# Patient Record
Sex: Female | Born: 1955
Health system: Southern US, Community
[De-identification: ages and names within clinical notes are randomized; demographics above are authoritative.]

## PROBLEM LIST (undated history)

## (undated) DIAGNOSIS — R011 Cardiac murmur, unspecified: Secondary | ICD-10-CM

## (undated) DIAGNOSIS — Z923 Personal history of irradiation: Secondary | ICD-10-CM

## (undated) DIAGNOSIS — I10 Essential (primary) hypertension: Secondary | ICD-10-CM

## (undated) DIAGNOSIS — G5603 Carpal tunnel syndrome, bilateral upper limbs: Secondary | ICD-10-CM

## (undated) DIAGNOSIS — G5712 Meralgia paresthetica, left lower limb: Secondary | ICD-10-CM

## (undated) DIAGNOSIS — K5790 Diverticulosis of intestine, part unspecified, without perforation or abscess without bleeding: Secondary | ICD-10-CM

## (undated) DIAGNOSIS — C50919 Malignant neoplasm of unspecified site of unspecified female breast: Secondary | ICD-10-CM

## (undated) HISTORY — DX: Diverticulosis of intestine, part unspecified, without perforation or abscess without bleeding: K57.90

## (undated) HISTORY — DX: Meralgia paresthetica, left lower limb: G57.12

## (undated) HISTORY — PX: CHOLECYSTECTOMY: SHX55

## (undated) HISTORY — DX: Cardiac murmur, unspecified: R01.1

## (undated) HISTORY — DX: Essential (primary) hypertension: I10

## (undated) HISTORY — DX: Carpal tunnel syndrome, bilateral upper limbs: G56.03

---

## 2009-08-22 ENCOUNTER — Emergency Department: Payer: Self-pay | Admitting: Emergency Medicine

## 2012-08-30 DIAGNOSIS — G8929 Other chronic pain: Secondary | ICD-10-CM | POA: Insufficient documentation

## 2013-02-03 LAB — HM PAP SMEAR

## 2014-03-03 ENCOUNTER — Ambulatory Visit: Payer: Self-pay | Admitting: Family Medicine

## 2015-05-04 LAB — HM MAMMOGRAPHY: HM MAMMO: NORMAL (ref 0–4)

## 2016-02-22 ENCOUNTER — Telehealth: Payer: Self-pay | Admitting: Family Medicine

## 2016-02-22 NOTE — Telephone Encounter (Signed)
I returned her call; in regards to a family member; not her personally

## 2016-02-22 NOTE — Telephone Encounter (Signed)
Pt called stated she would like a call back from Dr. Sanda Klein concerning a personal matter.

## 2016-05-24 HISTORY — PX: COLONOSCOPY: SHX174

## 2016-05-24 LAB — HM COLONOSCOPY

## 2016-12-02 DIAGNOSIS — G5712 Meralgia paresthetica, left lower limb: Secondary | ICD-10-CM

## 2016-12-02 DIAGNOSIS — G5603 Carpal tunnel syndrome, bilateral upper limbs: Secondary | ICD-10-CM

## 2016-12-02 DIAGNOSIS — K5790 Diverticulosis of intestine, part unspecified, without perforation or abscess without bleeding: Secondary | ICD-10-CM | POA: Insufficient documentation

## 2016-12-02 DIAGNOSIS — I10 Essential (primary) hypertension: Secondary | ICD-10-CM

## 2017-01-04 ENCOUNTER — Ambulatory Visit: Payer: BLUE CROSS/BLUE SHIELD | Admitting: Unknown Physician Specialty

## 2017-01-10 ENCOUNTER — Encounter: Payer: Self-pay | Admitting: Family Medicine

## 2017-01-10 ENCOUNTER — Encounter (INDEPENDENT_AMBULATORY_CARE_PROVIDER_SITE_OTHER): Payer: Self-pay

## 2017-01-10 ENCOUNTER — Ambulatory Visit (INDEPENDENT_AMBULATORY_CARE_PROVIDER_SITE_OTHER): Payer: BLUE CROSS/BLUE SHIELD | Admitting: Family Medicine

## 2017-01-10 VITALS — BP 127/86 | HR 80 | Temp 98.1°F | Resp 17 | Ht 60.25 in | Wt 187.0 lb

## 2017-01-10 DIAGNOSIS — I10 Essential (primary) hypertension: Secondary | ICD-10-CM

## 2017-01-10 DIAGNOSIS — Z13220 Encounter for screening for lipoid disorders: Secondary | ICD-10-CM

## 2017-01-10 DIAGNOSIS — G5712 Meralgia paresthetica, left lower limb: Secondary | ICD-10-CM

## 2017-01-10 DIAGNOSIS — Z114 Encounter for screening for human immunodeficiency virus [HIV]: Secondary | ICD-10-CM

## 2017-01-10 DIAGNOSIS — R300 Dysuria: Secondary | ICD-10-CM

## 2017-01-10 DIAGNOSIS — H6123 Impacted cerumen, bilateral: Secondary | ICD-10-CM | POA: Diagnosis not present

## 2017-01-10 DIAGNOSIS — Z1239 Encounter for other screening for malignant neoplasm of breast: Secondary | ICD-10-CM

## 2017-01-10 DIAGNOSIS — Z6836 Body mass index (BMI) 36.0-36.9, adult: Secondary | ICD-10-CM

## 2017-01-10 DIAGNOSIS — E6609 Other obesity due to excess calories: Secondary | ICD-10-CM | POA: Diagnosis not present

## 2017-01-10 DIAGNOSIS — Z1159 Encounter for screening for other viral diseases: Secondary | ICD-10-CM

## 2017-01-10 DIAGNOSIS — Z1231 Encounter for screening mammogram for malignant neoplasm of breast: Secondary | ICD-10-CM

## 2017-01-10 DIAGNOSIS — IMO0001 Reserved for inherently not codable concepts without codable children: Secondary | ICD-10-CM

## 2017-01-10 MED ORDER — LOSARTAN POTASSIUM-HCTZ 50-12.5 MG PO TABS: 1.0000 | ORAL_TABLET | Freq: Every day | ORAL | 1 refills | Status: DC

## 2017-01-10 NOTE — Assessment & Plan Note (Signed)
Under good control. Continue current regimen. Continue to monitor. Call with any concerns. 

## 2017-01-10 NOTE — Progress Notes (Signed)
BP 127/86 (BP Location: Left Arm, Patient Position: Sitting, Cuff Size: Large)   Pulse 80   Temp 98.1 F (36.7 C) (Oral)   Resp 17   Ht 5' 0.25" (1.53 m)   Wt 187 lb (84.8 kg)   SpO2 97%   BMI 36.22 kg/m    Subjective:    Patient ID: Jennifer Sanders, female    DOB: October 14, 1956, 61 y.o.   MRN: [id number]  HPI: Jennifer Sanders is a 61 y.o. female  Chief Complaint  Patient presents with  . Establish Care   EAG CLOGGED- usually gets them cleaned out about 2x a year Duration: about a month Involved ear(s):  bilateral Sensation of feeling clogged/plugged: yes- left Decreased/muffled hearing:yes Ear pain: no Fever: no Otorrhea: no Hearing loss: no Upper respiratory infection symptoms: no Using Q-Tips: no Status: stable History of cerumenosis: yes Treatments attempted: debrox about 2x a month  HYPERTENSION Hypertension status: controlled  Satisfied with current treatment? yes Duration of hypertension: chronic BP monitoring frequency:  not checking BP medication side effects:  no Medication compliance: excellent compliance Previous BP meds: losartan-hctz Aspirin: no Recurrent headaches: no Visual changes: no Palpitations: no Dyspnea: no Chest pain: no Lower extremity edema: no Dizzy/lightheaded: no  Active Ambulatory Problems    Diagnosis Date Noted  . Hypertension   . Bilateral carpal tunnel syndrome   . Meralgia paraesthetica, left   . Diverticulosis    Resolved Ambulatory Problems    Diagnosis Date Noted  . No Resolved Ambulatory Problems   Past Medical History:  Diagnosis Date  . Bilateral carpal tunnel syndrome   . Diverticulosis   . Hypertension   . Meralgia paraesthetica, left    Past Surgical History:  Procedure Laterality Date  . CHOLECYSTECTOMY  1990s  . COLONOSCOPY  05/24/2016   internal hemorrhoids, diverticulosis   Outpatient Encounter Prescriptions as of 01/10/2017  Medication Sig  . desonide (DESOWEN) 0.05 % lotion Apply  topically.  Marland Kitchen etodolac (LODINE) 500 MG tablet Take by mouth.  . gabapentin (NEURONTIN) 100 MG capsule One by mouth TID and may increase the night dose to 3 if needed.  Marland Kitchen losartan-hydrochlorothiazide (HYZAAR) 50-12.5 MG tablet Take 1 tablet by mouth daily.  Marland Kitchen pyridoxine (B-6) 100 MG tablet Take by mouth.  . tobramycin-dexamethasone (TOBRADEX) ophthalmic ointment Per eye doctor, use for flares of skin irritation around eyes  . [DISCONTINUED] losartan-hydrochlorothiazide (HYZAAR) 50-12.5 MG tablet Take by mouth.   No facility-administered encounter medications on file as of 01/10/2017.    No Known Allergies  Social History   Social History  . Marital status: Married    Spouse name: N/A  . Number of children: N/A  . Years of education: N/A   Social History Main Topics  . Smoking status: Never Smoker  . Smokeless tobacco: Never Used  . Alcohol use No  . Drug use: No  . Sexual activity: Not Asked   Other Topics Concern  . None   Social History Narrative  . None   Family History  Problem Relation Age of Onset  . Hyperlipidemia Mother   . Cancer Mother     lung  . Cancer Father     esophageal    Review of Systems  Constitutional: Negative.   HENT: Positive for congestion, ear discharge and ear pain. Negative for dental problem, drooling, facial swelling, hearing loss, mouth sores, nosebleeds, postnasal drip, rhinorrhea, sinus pressure, sneezing, sore throat, tinnitus, trouble swallowing and voice change.   Respiratory: Negative.   Cardiovascular:  Negative.   Psychiatric/Behavioral: Negative.     Per HPI unless specifically indicated above     Objective:    BP 127/86 (BP Location: Left Arm, Patient Position: Sitting, Cuff Size: Large)   Pulse 80   Temp 98.1 F (36.7 C) (Oral)   Resp 17   Ht 5' 0.25" (1.53 m)   Wt 187 lb (84.8 kg)   SpO2 97%   BMI 36.22 kg/m   Wt Readings from Last 3 Encounters:  01/10/17 187 lb (84.8 kg)    Physical Exam  Constitutional: She  is oriented to person, place, and time. She appears well-developed and well-nourished. No distress.  HENT:  Head: Normocephalic and atraumatic.  Right Ear: Hearing and external ear normal.  Left Ear: Hearing and external ear normal.  Nose: Nose normal.  Mouth/Throat: Oropharynx is clear and moist. No oropharyngeal exudate.  Cerumen impaction bilaterally  Eyes: Conjunctivae, EOM and lids are normal. Pupils are equal, round, and reactive to light. Right eye exhibits no discharge. Left eye exhibits no discharge. No scleral icterus.  Neck: Normal range of motion. Neck supple. No JVD present. No tracheal deviation present. No thyromegaly present.  Cardiovascular: Normal rate, regular rhythm, normal heart sounds and intact distal pulses.  Exam reveals no gallop and no friction rub.   No murmur heard. Pulmonary/Chest: Effort normal and breath sounds normal. No stridor. No respiratory distress. She has no wheezes. She has no rales. She exhibits no tenderness.  Musculoskeletal: Normal range of motion.  Lymphadenopathy:    She has no cervical adenopathy.  Neurological: She is alert and oriented to person, place, and time.  Skin: Skin is warm, dry and intact. No rash noted. She is not diaphoretic. No erythema. No pallor.  Psychiatric: She has a normal mood and affect. Her speech is normal and behavior is normal. Judgment and thought content normal. Cognition and memory are normal.  Nursing note and vitals reviewed.   Results for orders placed or performed in visit on 12/02/16  HM MAMMOGRAPHY  Result Value Ref Range   HM Mammogram Self Reported Normal 0-4 Bi-Rad, Self Reported Normal  HM PAP SMEAR  Result Value Ref Range   HM Pap smear per care everywhere   HM COLONOSCOPY  Result Value Ref Range   HM Colonoscopy Patient Reported See Report (in chart), Patient Reported      Assessment & Plan:   Problem List Items Addressed This Visit      Cardiovascular and Mediastinum   Hypertension -  Primary    Under good control. Continue current regimen. Continue to monitor. Call with any concerns.       Relevant Medications   losartan-hydrochlorothiazide (HYZAAR) 50-12.5 MG tablet   Other Relevant Orders   CBC with Differential/Platelet   Comprehensive metabolic panel   Microalbumin, Urine Waived     Nervous and Auditory   Meralgia paraesthetica, left    Stretches given today. Call with any concerns.       Relevant Medications   gabapentin (NEURONTIN) 100 MG capsule    Other Visit Diagnoses    Dysuria       UA done today.   Relevant Orders   UA/M w/rflx Culture, Routine   Screening for HIV (human immunodeficiency virus)       Labs drawn today. Await results.    Relevant Orders   HIV antibody   Screening for cholesterol level       Labs drawn today. Await results.    Relevant Orders   Lipid  Panel w/o Chol/HDL Ratio   Need for hepatitis C screening test       Labd drawn today. Await results.    Relevant Orders   Hepatitis C Antibody   Class 2 obesity due to excess calories with serious comorbidity and body mass index (BMI) of 36.0 to 36.9 in adult       Checking labs. Continue diet and exercise. Call with any concerns.    Relevant Orders   CBC with Differential/Platelet   Comprehensive metabolic panel   TSH   Screening for breast cancer       Mammogram ordered today.   Relevant Orders   MM DIGITAL SCREENING BILATERAL   Bilateral impacted cerumen       Ears flushed today.        Follow up plan: Return in about 6 months (around 07/10/2017) for Physical.

## 2017-01-10 NOTE — Assessment & Plan Note (Signed)
Stretches given today. Call with any concerns.

## 2017-01-11 ENCOUNTER — Encounter: Payer: Self-pay | Admitting: Family Medicine

## 2017-01-11 LAB — COMPREHENSIVE METABOLIC PANEL
ALBUMIN: 4.6 g/dL (ref 3.6–4.8)
ALT: 14 IU/L (ref 0–32)
AST: 22 IU/L (ref 0–40)
Albumin/Globulin Ratio: 1.4 (ref 1.2–2.2)
Alkaline Phosphatase: 84 IU/L (ref 39–117)
BUN / CREAT RATIO: 13 (ref 12–28)
BUN: 10 mg/dL (ref 8–27)
Bilirubin Total: 0.3 mg/dL (ref 0.0–1.2)
CO2: 27 mmol/L (ref 18–29)
Calcium: 9.6 mg/dL (ref 8.7–10.3)
Chloride: 100 mmol/L (ref 96–106)
Creatinine, Ser: 0.8 mg/dL (ref 0.57–1.00)
GFR, EST AFRICAN AMERICAN: 93 mL/min/{1.73_m2} (ref >59)
GFR, EST NON AFRICAN AMERICAN: 80 mL/min/{1.73_m2} (ref >59)
GLOBULIN, TOTAL: 3.2 g/dL (ref 1.5–4.5)
Glucose: 73 mg/dL (ref 65–99)
POTASSIUM: 3.8 mmol/L (ref 3.5–5.2)
SODIUM: 143 mmol/L (ref 134–144)
TOTAL PROTEIN: 7.8 g/dL (ref 6.0–8.5)

## 2017-01-11 LAB — CBC WITH DIFFERENTIAL/PLATELET
BASOS: 0 %
Basophils Absolute: 0 10*3/uL (ref 0.0–0.2)
EOS (ABSOLUTE): 0.2 10*3/uL (ref 0.0–0.4)
EOS: 2 %
HEMATOCRIT: 42.2 % (ref 34.0–46.6)
HEMOGLOBIN: 14.2 g/dL (ref 11.1–15.9)
IMMATURE GRANS (ABS): 0 10*3/uL (ref 0.0–0.1)
IMMATURE GRANULOCYTES: 0 %
Lymphocytes Absolute: 2 10*3/uL (ref 0.7–3.1)
Lymphs: 22 %
MCH: 26.5 pg — ABNORMAL LOW (ref 26.6–33.0)
MCHC: 33.6 g/dL (ref 31.5–35.7)
MCV: 79 fL (ref 79–97)
MONOS ABS: 0.6 10*3/uL (ref 0.1–0.9)
Monocytes: 7 %
NEUTROS PCT: 69 %
Neutrophils Absolute: 6.2 10*3/uL (ref 1.4–7.0)
Platelets: 254 10*3/uL (ref 150–379)
RBC: 5.35 x10E6/uL — ABNORMAL HIGH (ref 3.77–5.28)
RDW: 14.1 % (ref 12.3–15.4)
WBC: 9 10*3/uL (ref 3.4–10.8)

## 2017-01-11 LAB — TSH: TSH: 3.31 u[IU]/mL (ref 0.450–4.500)

## 2017-01-11 LAB — UA/M W/RFLX CULTURE, ROUTINE
Bilirubin, UA: NEGATIVE
Glucose, UA: NEGATIVE
Ketones, UA: NEGATIVE
LEUKOCYTES UA: NEGATIVE
Nitrite, UA: NEGATIVE
PROTEIN UA: NEGATIVE
Urobilinogen, Ur: 0.2 mg/dL (ref 0.2–1.0)
pH, UA: 6 (ref 5.0–7.5)

## 2017-01-11 LAB — LIPID PANEL W/O CHOL/HDL RATIO
Cholesterol, Total: 187 mg/dL (ref 100–199)
HDL: 66 mg/dL (ref >39)
LDL CALC: 102 mg/dL — AB (ref 0–99)
Triglycerides: 93 mg/dL (ref 0–149)
VLDL Cholesterol Cal: 19 mg/dL (ref 5–40)

## 2017-01-11 LAB — HEPATITIS C ANTIBODY: Hep C Virus Ab: <0.1 s/co ratio (ref 0.0–0.9)

## 2017-01-11 LAB — MICROALBUMIN, URINE WAIVED
CREATININE, URINE WAIVED: 50 mg/dL (ref 10–300)
MICROALB, UR WAIVED: 10 mg/L (ref 0–19)
Microalb/Creat Ratio: <30 mg/g (ref <30)

## 2017-01-11 LAB — HIV ANTIBODY (ROUTINE TESTING W REFLEX): HIV Screen 4th Generation wRfx: NONREACTIVE

## 2017-04-07 ENCOUNTER — Telehealth: Payer: Self-pay | Admitting: Family Medicine

## 2017-04-07 NOTE — Telephone Encounter (Signed)
Left message letting patient know that enough was sent to pharmacy to get her until her August appointment.

## 2017-04-07 NOTE — Telephone Encounter (Signed)
Patient needs a script sent to [facility] for Losartan-hydrochlorothiazide 50-12.5mg  tab taken as 1 daily if possible a 90 day supply  Thanks  [phone]  Raena if you need to talk with her.

## 2017-04-07 NOTE — Telephone Encounter (Signed)
Spoke with patient and gave her the information regarding the script.

## 2017-07-17 ENCOUNTER — Ambulatory Visit (INDEPENDENT_AMBULATORY_CARE_PROVIDER_SITE_OTHER): Payer: BLUE CROSS/BLUE SHIELD | Admitting: Family Medicine

## 2017-07-17 ENCOUNTER — Encounter: Payer: Self-pay | Admitting: Family Medicine

## 2017-07-17 VITALS — BP 119/85 | HR 72 | Temp 98.8°F | Ht 60.5 in | Wt 190.3 lb

## 2017-07-17 DIAGNOSIS — Z0000 Encounter for general adult medical examination without abnormal findings: Secondary | ICD-10-CM

## 2017-07-17 DIAGNOSIS — I10 Essential (primary) hypertension: Secondary | ICD-10-CM

## 2017-07-17 DIAGNOSIS — Z1239 Encounter for other screening for malignant neoplasm of breast: Secondary | ICD-10-CM

## 2017-07-17 LAB — UA/M W/RFLX CULTURE, ROUTINE
Bilirubin, UA: NEGATIVE
GLUCOSE, UA: NEGATIVE
KETONES UA: NEGATIVE
LEUKOCYTES UA: NEGATIVE
Nitrite, UA: NEGATIVE
PROTEIN UA: NEGATIVE
RBC, UA: NEGATIVE
SPEC GRAV UA: 1.015 (ref 1.005–1.030)
Urobilinogen, Ur: 0.2 mg/dL (ref 0.2–1.0)
pH, UA: 7 (ref 5.0–7.5)

## 2017-07-17 LAB — MICROALBUMIN, URINE WAIVED
CREATININE, URINE WAIVED: 50 mg/dL (ref 10–300)
MICROALB, UR WAIVED: 10 mg/L (ref 0–19)

## 2017-07-17 LAB — MICROSCOPIC EXAMINATION
Bacteria, UA: NONE SEEN
RBC, UA: NONE SEEN /hpf (ref 0–?)

## 2017-07-17 MED ORDER — LOSARTAN POTASSIUM-HCTZ 50-12.5 MG PO TABS: 1.0000 | ORAL_TABLET | Freq: Every day | ORAL | 1 refills | Status: DC

## 2017-07-17 NOTE — Progress Notes (Signed)
BP 119/85 (BP Location: Left Arm, Patient Position: Sitting, Cuff Size: Large)   Pulse 72   Temp 98.8 F (37.1 C)   Ht 5' 0.5" (1.537 m)   Wt 190 lb 5 oz (86.3 kg)   SpO2 96%   BMI 36.56 kg/m    Subjective:    Patient ID: Jennifer Sanders, female    DOB: December 15, 1955, 61 y.o.   MRN: [id number]  HPI: Jennifer Sanders is a 61 y.o. female presenting on 07/17/2017 for comprehensive medical examination. Current medical complaints include:  Husband was diagnosed with stage 4 cancer and she is his care giver. Has been under a lot of stress. Feeling OK, but not quite like herself. She notes that she is sleeping OK and trying to stay positive. Is not worried about her mood right now. She's hanging in there.   HYPERTENSION Hypertension status: controlled  Satisfied with current treatment? yes Duration of hypertension: chronic BP monitoring frequency:  not checking BP medication side effects:  no Medication compliance: excellent compliance Previous BP meds: losartan-hctz Aspirin: no Recurrent headaches: no Visual changes: no Palpitations: no Dyspnea: no Chest pain: no Lower extremity edema: no Dizzy/lightheaded: no   She currently lives with: husband Menopausal Symptoms: no  Depression Screen done today and results listed below:  Depression screen [facility] 2/9 07/17/2017 01/10/2017  Decreased Interest 0 0  Down, Depressed, Hopeless 1 0  PHQ - 2 Score 1 0  Altered sleeping 0 -  Tired, decreased energy 1 -  Change in appetite 0 -  Feeling bad or failure about yourself  0 -  Trouble concentrating 0 -  Moving slowly or fidgety/restless 0 -  Suicidal thoughts 0 -  PHQ-9 Score 2 -  Difficult doing work/chores Somewhat difficult -    Past Medical History:  Past Medical History:  Diagnosis Date  . Bilateral carpal tunnel syndrome   . Diverticulosis    found on colonoscopy on 05/24/16  . Hypertension   . Meralgia paraesthetica, left     Surgical History:  Past Surgical History:    Procedure Laterality Date  . CHOLECYSTECTOMY  1990s  . COLONOSCOPY  05/24/2016   internal hemorrhoids, diverticulosis    Medications:  Current Outpatient Prescriptions on File Prior to Visit  Medication Sig  . desonide (DESOWEN) 0.05 % lotion Apply topically.  Marland Kitchen etodolac (LODINE) 500 MG tablet Take by mouth.  . gabapentin (NEURONTIN) 100 MG capsule One by mouth TID and may increase the night dose to 3 if needed.  . pyridoxine (B-6) 100 MG tablet Take by mouth.  . tobramycin-dexamethasone (TOBRADEX) ophthalmic ointment Per eye doctor, use for flares of skin irritation around eyes   No current facility-administered medications on file prior to visit.     Allergies:  No Known Allergies  Social History:  Social History   Social History  . Marital status: Married    Spouse name: N/A  . Number of children: N/A  . Years of education: N/A   Occupational History  . Not on file.   Social History Main Topics  . Smoking status: Never Smoker  . Smokeless tobacco: Never Used  . Alcohol use No  . Drug use: No  . Sexual activity: Yes   Other Topics Concern  . Not on file   Social History Narrative  . No narrative on file   History  Smoking Status  . Never Smoker  Smokeless Tobacco  . Never Used   History  Alcohol Use No  Family History:  Family History  Problem Relation Age of Onset  . Hyperlipidemia Mother   . Cancer Mother        lung  . Cancer Father        esophageal    Past medical history, surgical history, medications, allergies, family history and social history reviewed with patient today and changes made to appropriate areas of the chart.   Review of Systems  Constitutional: Negative.   HENT: Negative.   Eyes: Negative.   Respiratory: Negative.   Cardiovascular: Negative.   Gastrointestinal: Positive for blood in stool (due to hemorrhoids). Negative for abdominal pain, constipation, diarrhea, heartburn, melena, nausea and vomiting.   Genitourinary: Negative.   Musculoskeletal: Negative.   Skin: Negative.   Neurological: Positive for tingling. Negative for dizziness, tremors, sensory change, speech change, focal weakness, seizures, loss of consciousness and headaches.  Endo/Heme/Allergies: Negative.   Psychiatric/Behavioral: Positive for depression. Negative for hallucinations, memory loss, substance abuse and suicidal ideas. The patient is nervous/anxious. The patient does not have insomnia.     All other ROS negative except what is listed above and in the HPI.      Objective:    BP 119/85 (BP Location: Left Arm, Patient Position: Sitting, Cuff Size: Large)   Pulse 72   Temp 98.8 F (37.1 C)   Ht 5' 0.5" (1.537 m)   Wt 190 lb 5 oz (86.3 kg)   SpO2 96%   BMI 36.56 kg/m   Wt Readings from Last 3 Encounters:  07/17/17 190 lb 5 oz (86.3 kg)  01/10/17 187 lb (84.8 kg)    Physical Exam  Constitutional: She is oriented to person, place, and time. She appears well-developed and well-nourished. No distress.  HENT:  Head: Normocephalic and atraumatic.  Right Ear: Hearing, tympanic membrane, external ear and ear canal normal.  Left Ear: Hearing, tympanic membrane, external ear and ear canal normal.  Nose: Nose normal.  Mouth/Throat: Uvula is midline, oropharynx is clear and moist and mucous membranes are normal. No oropharyngeal exudate.  Eyes: Pupils are equal, round, and reactive to light. Conjunctivae, EOM and lids are normal. Right eye exhibits no discharge. Left eye exhibits no discharge. No scleral icterus.  Neck: Normal range of motion. Neck supple. No JVD present. No tracheal deviation present. No thyromegaly present.  Cardiovascular: Normal rate, regular rhythm, normal heart sounds and intact distal pulses.  Exam reveals no gallop and no friction rub.   No murmur heard. Pulmonary/Chest: Effort normal and breath sounds normal. No stridor. No respiratory distress. She has no wheezes. She has no rales. She  exhibits no tenderness. Right breast exhibits no inverted nipple, no mass, no nipple discharge, no skin change and no tenderness. Left breast exhibits no inverted nipple, no mass, no nipple discharge, no skin change and no tenderness. Breasts are symmetrical.  Abdominal: Soft. Bowel sounds are normal. She exhibits no distension and no mass. There is no tenderness. There is no rebound and no guarding.  Genitourinary:  Genitourinary Comments: Pelvic exam deferred with shared decision making  Musculoskeletal: Normal range of motion. She exhibits no edema, tenderness or deformity.  Lymphadenopathy:    She has no cervical adenopathy.  Neurological: She is alert and oriented to person, place, and time. She has normal reflexes. She displays normal reflexes. No cranial nerve deficit. She exhibits normal muscle tone. Coordination normal.  Skin: Skin is warm, dry and intact. No rash noted. She is not diaphoretic. No erythema. No pallor.  Psychiatric: She has a normal  mood and affect. Her speech is normal and behavior is normal. Judgment and thought content normal. Cognition and memory are normal.  Nursing note and vitals reviewed.   Results for orders placed or performed in visit on 01/10/17  CBC with Differential/Platelet  Result Value Ref Range   WBC 9.0 3.4 - 10.8 x10E3/uL   RBC 5.35 (H) 3.77 - 5.28 x10E6/uL   Hemoglobin 14.2 11.1 - 15.9 g/dL   Hematocrit 42.2 34.0 - 46.6 %   MCV 79 79 - 97 fL   MCH 26.5 (L) 26.6 - 33.0 pg   MCHC 33.6 31.5 - 35.7 g/dL   RDW 14.1 12.3 - 15.4 %   Platelets 254 150 - 379 x10E3/uL   Neutrophils 69 Not Estab. %   Lymphs 22 Not Estab. %   Monocytes 7 Not Estab. %   Eos 2 Not Estab. %   Basos 0 Not Estab. %   Neutrophils Absolute 6.2 1.4 - 7.0 x10E3/uL   Lymphocytes Absolute 2.0 0.7 - 3.1 x10E3/uL   Monocytes Absolute 0.6 0.1 - 0.9 x10E3/uL   EOS (ABSOLUTE) 0.2 0.0 - 0.4 x10E3/uL   Basophils Absolute 0.0 0.0 - 0.2 x10E3/uL   Immature Granulocytes 0 Not Estab. %     Immature Grans (Abs) 0.0 0.0 - 0.1 x10E3/uL  Comprehensive metabolic panel  Result Value Ref Range   Glucose 73 65 - 99 mg/dL   BUN 10 8 - 27 mg/dL   Creatinine, Ser 0.80 0.57 - 1.00 mg/dL   GFR calc non Af Amer 80 >59 mL/min/1.73   GFR calc Af Amer 93 >59 mL/min/1.73   BUN/Creatinine Ratio 13 12 - 28   Sodium 143 134 - 144 mmol/L   Potassium 3.8 3.5 - 5.2 mmol/L   Chloride 100 96 - 106 mmol/L   CO2 27 18 - 29 mmol/L   Calcium 9.6 8.7 - 10.3 mg/dL   Total Protein 7.8 6.0 - 8.5 g/dL   Albumin 4.6 3.6 - 4.8 g/dL   Globulin, Total 3.2 1.5 - 4.5 g/dL   Albumin/Globulin Ratio 1.4 1.2 - 2.2   Bilirubin Total 0.3 0.0 - 1.2 mg/dL   Alkaline Phosphatase 84 39 - 117 IU/L   AST 22 0 - 40 IU/L   ALT 14 0 - 32 IU/L  Lipid Panel w/o Chol/HDL Ratio  Result Value Ref Range   Cholesterol, Total 187 100 - 199 mg/dL   Triglycerides 93 0 - 149 mg/dL   HDL 66 >39 mg/dL   VLDL Cholesterol Cal 19 5 - 40 mg/dL   LDL Calculated 102 (H) 0 - 99 mg/dL  Microalbumin, Urine Waived  Result Value Ref Range   Microalb, Ur Waived 10 0 - 19 mg/L   Creatinine, Urine Waived 50 10 - 300 mg/dL   Microalb/Creat Ratio <30 <30 mg/g  TSH  Result Value Ref Range   TSH 3.310 0.450 - 4.500 uIU/mL  UA/M w/rflx Culture, Routine  Result Value Ref Range   Specific Gravity, UA <1.005 (L) 1.005 - 1.030   pH, UA 6.0 5.0 - 7.5   Color, UA Yellow Yellow   Appearance Ur Clear Clear   Leukocytes, UA Negative Negative   Protein, UA Negative Negative/Trace   Glucose, UA Negative Negative   Ketones, UA Negative Negative   RBC, UA Trace (A) Negative   Bilirubin, UA Negative Negative   Urobilinogen, Ur 0.2 0.2 - 1.0 mg/dL   Nitrite, UA Negative Negative  HIV antibody  Result Value Ref Range   HIV Screen  4th Generation wRfx Non Reactive Non Reactive  Hepatitis C Antibody  Result Value Ref Range   Hep C Virus Ab <0.1 0.0 - 0.9 s/co ratio      Assessment & Plan:   Problem List Items Addressed This Visit       Cardiovascular and Mediastinum   Hypertension    Under good control. Call with any concerns. Refill given today.       Relevant Medications   losartan-hydrochlorothiazide (HYZAAR) 50-12.5 MG tablet   Other Relevant Orders   Comprehensive metabolic panel   Microalbumin, Urine Waived    Other Visit Diagnoses    Routine general medical examination at a health care facility    -  Primary   Vaccines up to date. Screening labs checked today. Pap and colonoscopy up to date. Mammogram ordered today. Continue diet and exercise.    Relevant Orders   CBC with Differential/Platelet   Comprehensive metabolic panel   Lipid Panel w/o Chol/HDL Ratio   Microalbumin, Urine Waived   TSH   UA/M w/rflx Culture, Routine       Follow up plan: Return in about 6 months (around 01/17/2018) for BP follow up.   LABORATORY TESTING:  - Pap smear: up to date  IMMUNIZATIONS:   - Tdap: Tetanus vaccination status reviewed: last tetanus booster within 10 years. - Influenza: Postponed to flu season - Pneumovax: Not applicable - Prevnar: Not applicable - Zostavax vaccine: Up to date  SCREENING: -Mammogram: Ordered today  - Colonoscopy: Up to date  - Bone Density: Not applicable   PATIENT COUNSELING:   Advised to take 1 mg of folate supplement per day if capable of pregnancy.   Sexuality: Discussed sexually transmitted diseases, partner selection, use of condoms, avoidance of unintended pregnancy  and contraceptive alternatives.   Advised to avoid cigarette smoking.  I discussed with the patient that most people either abstain from alcohol or drink within safe limits (<=14/week and <=4 drinks/occasion for males, <=7/weeks and <= 3 drinks/occasion for females) and that the risk for alcohol disorders and other health effects rises proportionally with the number of drinks per week and how often a drinker exceeds daily limits.  Discussed cessation/primary prevention of drug use and availability of treatment  for abuse.   Diet: Encouraged to adjust caloric intake to maintain  or achieve ideal body weight, to reduce intake of dietary saturated fat and total fat, to limit sodium intake by avoiding high sodium foods and not adding table salt, and to maintain adequate dietary potassium and calcium preferably from fresh fruits, vegetables, and low-fat dairy products.    stressed the importance of regular exercise  Injury prevention: Discussed safety belts, safety helmets, smoke detector, smoking near bedding or upholstery.   Dental health: Discussed importance of regular tooth brushing, flossing, and dental visits.    NEXT PREVENTATIVE PHYSICAL DUE IN 1 YEAR. Return in about 6 months (around 01/17/2018) for BP follow up.

## 2017-07-17 NOTE — Assessment & Plan Note (Signed)
Under good control. Call with any concerns. Refill given today.

## 2017-07-17 NOTE — Patient Instructions (Addendum)
Health Maintenance, Female Adopting a healthy lifestyle and getting preventive care can go a long way to promote health and wellness. Talk with your health care provider about what schedule of regular examinations is right for you. This is a good chance for you to check in with your provider about disease prevention and staying healthy. In between checkups, there are plenty of things you can do on your own. Experts have done a lot of research about which lifestyle changes and preventive measures are most likely to keep you healthy. Ask your health care provider for more information. Weight and diet Eat a healthy diet  Be sure to include plenty of vegetables, fruits, low-fat dairy products, and lean protein.  Do not eat a lot of foods high in solid fats, added sugars, or salt.  Get regular exercise. This is one of the most important things you can do for your health. ? Most adults should exercise for at least 150 minutes each week. The exercise should increase your heart rate and make you sweat (moderate-intensity exercise). ? Most adults should also do strengthening exercises at least twice a week. This is in addition to the moderate-intensity exercise.  Maintain a healthy weight  Body mass index (BMI) is a measurement that can be used to identify possible weight problems. It estimates body fat based on height and weight. Your health care provider can help determine your BMI and help you achieve or maintain a healthy weight.  For females 20 years of age and older: ? A BMI below 18.5 is considered underweight. ? A BMI of 18.5 to 24.9 is normal. ? A BMI of 25 to 29.9 is considered overweight. ? A BMI of 30 and above is considered obese.  Watch levels of cholesterol and blood lipids  You should start having your blood tested for lipids and cholesterol at 61 years of age, then have this test every 5 years.  You may need to have your cholesterol levels checked more often if: ? Your lipid or  cholesterol levels are high. ? You are older than 61 years of age. ? You are at high risk for heart disease.  Cancer screening Lung Cancer  Lung cancer screening is recommended for adults 55-80 years old who are at high risk for lung cancer because of a history of smoking.  A yearly low-dose CT scan of the lungs is recommended for people who: ? Currently smoke. ? Have quit within the past 15 years. ? Have at least a 30-pack-year history of smoking. A pack year is smoking an average of one pack of cigarettes a day for 1 year.  Yearly screening should continue until it has been 15 years since you quit.  Yearly screening should stop if you develop a health problem that would prevent you from having lung cancer treatment.  Breast Cancer  Practice breast self-awareness. This means understanding how your breasts normally appear and feel.  It also means doing regular breast self-exams. Let your health care provider know about any changes, no matter how small.  If you are in your 20s or 30s, you should have a clinical breast exam (CBE) by a health care provider every 1-3 years as part of a regular health exam.  If you are 40 or older, have a CBE every year. Also consider having a breast X-ray (mammogram) every year.  If you have a family history of breast cancer, talk to your health care provider about genetic screening.  If you are at high risk   for breast cancer, talk to your health care provider about having an MRI and a mammogram every year.  Breast cancer gene (BRCA) assessment is recommended for women who have family members with BRCA-related cancers. BRCA-related cancers include: ? Breast. ? Ovarian. ? Tubal. ? Peritoneal cancers.  Results of the assessment will determine the need for genetic counseling and BRCA1 and BRCA2 testing.  Cervical Cancer Your health care provider may recommend that you be screened regularly for cancer of the pelvic organs (ovaries, uterus, and  vagina). This screening involves a pelvic examination, including checking for microscopic changes to the surface of your cervix (Pap test). You may be encouraged to have this screening done every 3 years, beginning at age 22.  For women ages 56-65, health care providers may recommend pelvic exams and Pap testing every 3 years, or they may recommend the Pap and pelvic exam, combined with testing for human papilloma virus (HPV), every 5 years. Some types of HPV increase your risk of cervical cancer. Testing for HPV may also be done on women of any age with unclear Pap test results.  Other health care providers may not recommend any screening for nonpregnant women who are considered low risk for pelvic cancer and who do not have symptoms. Ask your health care provider if a screening pelvic exam is right for you.  If you have had past treatment for cervical cancer or a condition that could lead to cancer, you need Pap tests and screening for cancer for at least 20 years after your treatment. If Pap tests have been discontinued, your risk factors (such as having a new sexual partner) need to be reassessed to determine if screening should resume. Some women have medical problems that increase the chance of getting cervical cancer. In these cases, your health care provider may recommend more frequent screening and Pap tests.  Colorectal Cancer  This type of cancer can be detected and often prevented.  Routine colorectal cancer screening usually begins at 61 years of age and continues through 61 years of age.  Your health care provider may recommend screening at an earlier age if you have risk factors for colon cancer.  Your health care provider may also recommend using home test kits to check for hidden blood in the stool.  A small camera at the end of a tube can be used to examine your colon directly (sigmoidoscopy or colonoscopy). This is done to check for the earliest forms of colorectal  cancer.  Routine screening usually begins at age 33.  Direct examination of the colon should be repeated every 5-10 years through 61 years of age. However, you may need to be screened more often if early forms of precancerous polyps or small growths are found.  Skin Cancer  Check your skin from head to toe regularly.  Tell your health care provider about any new moles or changes in moles, especially if there is a change in a mole's shape or color.  Also tell your health care provider if you have a mole that is larger than the size of a pencil eraser.  Always use sunscreen. Apply sunscreen liberally and repeatedly throughout the day.  Protect yourself by wearing long sleeves, pants, a wide-brimmed hat, and sunglasses whenever you are outside.  Heart disease, diabetes, and high blood pressure  High blood pressure causes heart disease and increases the risk of stroke. High blood pressure is more likely to develop in: ? People who have blood pressure in the high end of  the normal range (130-139/85-89 mm Hg). ? People who are overweight or obese. ? People who are African American.  If you are 21-29 years of age, have your blood pressure checked every 3-5 years. If you are 3 years of age or older, have your blood pressure checked every year. You should have your blood pressure measured twice-once when you are at a hospital or clinic, and once when you are not at a hospital or clinic. Record the average of the two measurements. To check your blood pressure when you are not at a hospital or clinic, you can use: ? An automated blood pressure machine at a pharmacy. ? A home blood pressure monitor.  If you are between 17 years and 37 years old, ask your health care provider if you should take aspirin to prevent strokes.  Have regular diabetes screenings. This involves taking a blood sample to check your fasting blood sugar level. ? If you are at a normal weight and have a low risk for diabetes,  have this test once every three years after 61 years of age. ? If you are overweight and have a high risk for diabetes, consider being tested at a younger age or more often. Preventing infection Hepatitis B  If you have a higher risk for hepatitis B, you should be screened for this virus. You are considered at high risk for hepatitis B if: ? You were born in a country where hepatitis B is common. Ask your health care provider which countries are considered high risk. ? Your parents were born in a high-risk country, and you have not been immunized against hepatitis B (hepatitis B vaccine). ? You have HIV or AIDS. ? You use needles to inject street drugs. ? You live with someone who has hepatitis B. ? You have had sex with someone who has hepatitis B. ? You get hemodialysis treatment. ? You take certain medicines for conditions, including cancer, organ transplantation, and autoimmune conditions.  Hepatitis C  Blood testing is recommended for: ? Everyone born from [birth year] through 1965. ? Anyone with known risk factors for hepatitis C.  Sexually transmitted infections (STIs)  You should be screened for sexually transmitted infections (STIs) including gonorrhea and chlamydia if: ? You are sexually active and are younger than 61 years of age. ? You are older than 61 years of age and your health care provider tells you that you are at risk for this type of infection. ? Your sexual activity has changed since you were last screened and you are at an increased risk for chlamydia or gonorrhea. Ask your health care provider if you are at risk.  If you do not have HIV, but are at risk, it may be recommended that you take a prescription medicine daily to prevent HIV infection. This is called pre-exposure prophylaxis (PrEP). You are considered at risk if: ? You are sexually active and do not regularly use condoms or know the HIV status of your partner(s). ? You take drugs by injection. ? You are  sexually active with a partner who has HIV.  Talk with your health care provider about whether you are at high risk of being infected with HIV. If you choose to begin PrEP, you should first be tested for HIV. You should then be tested every 3 months for as long as you are taking PrEP. Pregnancy  If you are premenopausal and you may become pregnant, ask your health care provider about preconception counseling.  If you may become  pregnant, take 400 to 800 micrograms (mcg) of folic acid every day.  If you want to prevent pregnancy, talk to your health care provider about birth control (contraception). Osteoporosis and menopause  Osteoporosis is a disease in which the bones lose minerals and strength with aging. This can result in serious bone fractures. Your risk for osteoporosis can be identified using a bone density scan.  If you are 28 years of age or older, or if you are at risk for osteoporosis and fractures, ask your health care provider if you should be screened.  Ask your health care provider whether you should take a calcium or vitamin D supplement to lower your risk for osteoporosis.  Menopause may have certain physical symptoms and risks.  Hormone replacement therapy may reduce some of these symptoms and risks. Talk to your health care provider about whether hormone replacement therapy is right for you. Follow these instructions at home:  Schedule regular health, dental, and eye exams.  Stay current with your immunizations.  Do not use any tobacco products including cigarettes, chewing tobacco, or electronic cigarettes.  If you are pregnant, do not drink alcohol.  If you are breastfeeding, limit how much and how often you drink alcohol.  Limit alcohol intake to no more than 1 drink per day for nonpregnant women. One drink equals 12 ounces of beer, 5 ounces of wine, or 1 ounces of hard liquor.  Do not use street drugs.  Do not share needles.  Ask your health care  provider for help if you need support or information about quitting drugs.  Tell your health care provider if you often feel depressed.  Tell your health care provider if you have ever been abused or do not feel safe at home. This information is not intended to replace advice given to you by your health care provider. Make sure you discuss any questions you have with your health care provider. Document Released: 05/30/2011 Document Revised: 04/21/2016 Document Reviewed: 08/18/2015 Elsevier Interactive Patient Education  2018 [place] Maintenance for Postmenopausal Women Menopause is a normal process in which your reproductive ability comes to an end. This process happens gradually over a span of months to years, usually between the ages of 96 and 42. Menopause is complete when you have missed 12 consecutive menstrual periods. It is important to talk with your health care provider about some of the most common conditions that affect postmenopausal women, such as heart disease, cancer, and bone loss (osteoporosis). Adopting a healthy lifestyle and getting preventive care can help to promote your health and wellness. Those actions can also lower your chances of developing some of these common conditions. What should I know about menopause? During menopause, you may experience a number of symptoms, such as:  Moderate-to-severe hot flashes.  Night sweats.  Decrease in sex drive.  Mood swings.  Headaches.  Tiredness.  Irritability.  Memory problems.  Insomnia.  Choosing to treat or not to treat menopausal changes is an individual decision that you make with your health care provider. What should I know about hormone replacement therapy and supplements? Hormone therapy products are effective for treating symptoms that are associated with menopause, such as hot flashes and night sweats. Hormone replacement carries certain risks, especially as you become older. If you are  thinking about using estrogen or estrogen with progestin treatments, discuss the benefits and risks with your health care provider. What should I know about heart disease and stroke? Heart disease, heart attack, and stroke  become more likely as you age. This may be due, in part, to the hormonal changes that your body experiences during menopause. These can affect how your body processes dietary fats, triglycerides, and cholesterol. Heart attack and stroke are both medical emergencies. There are many things that you can do to help prevent heart disease and stroke:  Have your blood pressure checked at least every 1-2 years. High blood pressure causes heart disease and increases the risk of stroke.  If you are 55-79 years old, ask your health care provider if you should take aspirin to prevent a heart attack or a stroke.  Do not use any tobacco products, including cigarettes, chewing tobacco, or electronic cigarettes. If you need help quitting, ask your health care provider.  It is important to eat a healthy diet and maintain a healthy weight. ? Be sure to include plenty of vegetables, fruits, low-fat dairy products, and lean protein. ? Avoid eating foods that are high in solid fats, added sugars, or salt (sodium).  Get regular exercise. This is one of the most important things that you can do for your health. ? Try to exercise for at least 150 minutes each week. The type of exercise that you do should increase your heart rate and make you sweat. This is known as moderate-intensity exercise. ? Try to do strengthening exercises at least twice each week. Do these in addition to the moderate-intensity exercise.  Know your numbers.Ask your health care provider to check your cholesterol and your blood glucose. Continue to have your blood tested as directed by your health care provider.  What should I know about cancer screening? There are several types of cancer. Take the following steps to reduce  your risk and to catch any cancer development as early as possible. Breast Cancer  Practice breast self-awareness. ? This means understanding how your breasts normally appear and feel. ? It also means doing regular breast self-exams. Let your health care provider know about any changes, no matter how small.  If you are 40 or older, have a clinician do a breast exam (clinical breast exam or CBE) every year. Depending on your age, family history, and medical history, it may be recommended that you also have a yearly breast X-ray (mammogram).  If you have a family history of breast cancer, talk with your health care provider about genetic screening.  If you are at high risk for breast cancer, talk with your health care provider about having an MRI and a mammogram every year.  Breast cancer (BRCA) gene test is recommended for women who have family members with BRCA-related cancers. Results of the assessment will determine the need for genetic counseling and BRCA1 and for BRCA2 testing. BRCA-related cancers include these types: ? Breast. This occurs in males or females. ? Ovarian. ? Tubal. This may also be called fallopian tube cancer. ? Cancer of the abdominal or pelvic lining (peritoneal cancer). ? Prostate. ? Pancreatic.  Cervical, Uterine, and Ovarian Cancer Your health care provider may recommend that you be screened regularly for cancer of the pelvic organs. These include your ovaries, uterus, and vagina. This screening involves a pelvic exam, which includes checking for microscopic changes to the surface of your cervix (Pap test).  For women ages 21-65, health care providers may recommend a pelvic exam and a Pap test every three years. For women ages 30-65, they may recommend the Pap test and pelvic exam, combined with testing for human papilloma virus (HPV), every five years. Some types   of HPV increase your risk of cervical cancer. Testing for HPV may also be done on women of any age who  have unclear Pap test results.  Other health care providers may not recommend any screening for nonpregnant women who are considered low risk for pelvic cancer and have no symptoms. Ask your health care provider if a screening pelvic exam is right for you.  If you have had past treatment for cervical cancer or a condition that could lead to cancer, you need Pap tests and screening for cancer for at least 20 years after your treatment. If Pap tests have been discontinued for you, your risk factors (such as having a new sexual partner) need to be reassessed to determine if you should start having screenings again. Some women have medical problems that increase the chance of getting cervical cancer. In these cases, your health care provider may recommend that you have screening and Pap tests more often.  If you have a family history of uterine cancer or ovarian cancer, talk with your health care provider about genetic screening.  If you have vaginal bleeding after reaching menopause, tell your health care provider.  There are currently no reliable tests available to screen for ovarian cancer.  Lung Cancer Lung cancer screening is recommended for adults 20-34 years old who are at high risk for lung cancer because of a history of smoking. A yearly low-dose CT scan of the lungs is recommended if you:  Currently smoke.  Have a history of at least 30 pack-years of smoking and you currently smoke or have quit within the past 15 years. A pack-year is smoking an average of one pack of cigarettes per day for one year.  Yearly screening should:  Continue until it has been 15 years since you quit.  Stop if you develop a health problem that would prevent you from having lung cancer treatment.  Colorectal Cancer  This type of cancer can be detected and can often be prevented.  Routine colorectal cancer screening usually begins at age 91 and continues through age 40.  If you have risk factors for colon  cancer, your health care provider may recommend that you be screened at an earlier age.  If you have a family history of colorectal cancer, talk with your health care provider about genetic screening.  Your health care provider may also recommend using home test kits to check for hidden blood in your stool.  A small camera at the end of a tube can be used to examine your colon directly (sigmoidoscopy or colonoscopy). This is done to check for the earliest forms of colorectal cancer.  Direct examination of the colon should be repeated every 5-10 years until age 59. However, if early forms of precancerous polyps or small growths are found or if you have a family history or genetic risk for colorectal cancer, you may need to be screened more often.  Skin Cancer  Check your skin from head to toe regularly.  Monitor any moles. Be sure to tell your health care provider: ? About any new moles or changes in moles, especially if there is a change in a mole's shape or color. ? If you have a mole that is larger than the size of a pencil eraser.  If any of your family members has a history of skin cancer, especially at a young age, talk with your health care provider about genetic screening.  Always use sunscreen. Apply sunscreen liberally and repeatedly throughout the day.  Whenever you are outside, protect yourself by wearing long sleeves, pants, a wide-brimmed hat, and sunglasses.  What should I know about osteoporosis? Osteoporosis is a condition in which bone destruction happens more quickly than new bone creation. After menopause, you may be at an increased risk for osteoporosis. To help prevent osteoporosis or the bone fractures that can happen because of osteoporosis, the following is recommended:  If you are 13-49 years old, get at least 1,000 mg of calcium and at least 600 mg of vitamin D per day.  If you are older than age 83 but younger than age 38, get at least 1,200 mg of calcium and  at least 600 mg of vitamin D per day.  If you are older than age 77, get at least 1,200 mg of calcium and at least 800 mg of vitamin D per day.  Smoking and excessive alcohol intake increase the risk of osteoporosis. Eat foods that are rich in calcium and vitamin D, and do weight-bearing exercises several times each week as directed by your health care provider. What should I know about how menopause affects my mental health? Depression may occur at any age, but it is more common as you become older. Common symptoms of depression include:  Low or sad mood.  Changes in sleep patterns.  Changes in appetite or eating patterns.  Feeling an overall lack of motivation or enjoyment of activities that you previously enjoyed.  Frequent crying spells.  Talk with your health care provider if you think that you are experiencing depression. What should I know about immunizations? It is important that you get and maintain your immunizations. These include:  Tetanus, diphtheria, and pertussis (Tdap) booster vaccine.  Influenza every year before the flu season begins.  Pneumonia vaccine.  Shingles vaccine.  Your health care provider may also recommend other immunizations. This information is not intended to replace advice given to you by your health care provider. Make sure you discuss any questions you have with your health care provider. Document Released: 01/06/2006 Document Revised: 06/03/2016 Document Reviewed: 08/18/2015 Elsevier Interactive Patient Education  2018 [employer].

## 2017-07-18 ENCOUNTER — Encounter: Payer: Self-pay | Admitting: Family Medicine

## 2017-07-18 LAB — CBC WITH DIFFERENTIAL/PLATELET
BASOS ABS: 0 10*3/uL (ref 0.0–0.2)
Basos: 0 %
EOS (ABSOLUTE): 0.2 10*3/uL (ref 0.0–0.4)
Eos: 2 %
HEMOGLOBIN: 14.4 g/dL (ref 11.1–15.9)
Hematocrit: 44.2 % (ref 34.0–46.6)
Immature Grans (Abs): 0 10*3/uL (ref 0.0–0.1)
Immature Granulocytes: 0 %
Lymphocytes Absolute: 1.9 10*3/uL (ref 0.7–3.1)
Lymphs: 20 %
MCH: 27 pg (ref 26.6–33.0)
MCHC: 32.6 g/dL (ref 31.5–35.7)
MCV: 83 fL (ref 79–97)
MONOCYTES: 5 %
Monocytes Absolute: 0.4 10*3/uL (ref 0.1–0.9)
NEUTROS PCT: 73 %
Neutrophils Absolute: 6.9 10*3/uL (ref 1.4–7.0)
PLATELETS: 266 10*3/uL (ref 150–379)
RBC: 5.34 x10E6/uL — AB (ref 3.77–5.28)
RDW: 14.4 % (ref 12.3–15.4)
WBC: 9.4 10*3/uL (ref 3.4–10.8)

## 2017-07-18 LAB — COMPREHENSIVE METABOLIC PANEL
ALT: 14 IU/L (ref 0–32)
AST: 22 IU/L (ref 0–40)
Albumin/Globulin Ratio: 1.5 (ref 1.2–2.2)
Albumin: 4.4 g/dL (ref 3.6–4.8)
Alkaline Phosphatase: 76 IU/L (ref 39–117)
BUN/Creatinine Ratio: 11 — ABNORMAL LOW (ref 12–28)
BUN: 9 mg/dL (ref 8–27)
Bilirubin Total: 0.5 mg/dL (ref 0.0–1.2)
CALCIUM: 9.5 mg/dL (ref 8.7–10.3)
CO2: 25 mmol/L (ref 20–29)
CREATININE: 0.83 mg/dL (ref 0.57–1.00)
Chloride: 99 mmol/L (ref 96–106)
GFR calc Af Amer: 89 mL/min/{1.73_m2} (ref >59)
GFR, EST NON AFRICAN AMERICAN: 77 mL/min/{1.73_m2} (ref >59)
GLOBULIN, TOTAL: 2.9 g/dL (ref 1.5–4.5)
GLUCOSE: 93 mg/dL (ref 65–99)
Potassium: 4 mmol/L (ref 3.5–5.2)
Sodium: 140 mmol/L (ref 134–144)
Total Protein: 7.3 g/dL (ref 6.0–8.5)

## 2017-07-18 LAB — LIPID PANEL W/O CHOL/HDL RATIO
Cholesterol, Total: 175 mg/dL (ref 100–199)
HDL: 58 mg/dL (ref >39)
LDL CALC: 94 mg/dL (ref 0–99)
TRIGLYCERIDES: 115 mg/dL (ref 0–149)
VLDL CHOLESTEROL CAL: 23 mg/dL (ref 5–40)

## 2017-07-18 LAB — TSH: TSH: 2.24 u[IU]/mL (ref 0.450–4.500)

## 2017-12-04 ENCOUNTER — Telehealth: Payer: Self-pay

## 2017-12-04 MED ORDER — LISINOPRIL-HYDROCHLOROTHIAZIDE 20-12.5 MG PO TABS: 1.0000 | ORAL_TABLET | Freq: Every day | ORAL | 1 refills | Status: DC

## 2017-12-04 NOTE — Telephone Encounter (Signed)
Rx for lisinopril sent to her pharmacy until losartan is off recall

## 2017-12-04 NOTE — Telephone Encounter (Signed)
Copied from [place]. Topic: General - Other >> Dec 04, 2017  9:21 AM Yvette Rack wrote: Reason for CRM: patient calling to change her blood pressure medication from Hyzaar to lisinopril 50/12.5 mg she states that its on recall

## 2017-12-04 NOTE — Telephone Encounter (Signed)
Recall on Losartan-HCTZ, please change  [facility]

## 2017-12-04 NOTE — Telephone Encounter (Signed)
Done this AM

## 2018-01-17 ENCOUNTER — Encounter: Payer: Self-pay | Admitting: Family Medicine

## 2018-01-17 ENCOUNTER — Ambulatory Visit: Payer: BLUE CROSS/BLUE SHIELD | Admitting: Family Medicine

## 2018-01-17 VITALS — BP 115/78 | HR 75 | Temp 98.7°F | Wt 186.6 lb

## 2018-01-17 DIAGNOSIS — I10 Essential (primary) hypertension: Secondary | ICD-10-CM

## 2018-01-17 DIAGNOSIS — F419 Anxiety disorder, unspecified: Secondary | ICD-10-CM | POA: Diagnosis not present

## 2018-01-17 NOTE — Assessment & Plan Note (Signed)
Under good control. Continue current regimen. Refill given today. Call with any concerns.

## 2018-01-17 NOTE — Progress Notes (Signed)
BP 115/78 (BP Location: Left Arm, Patient Position: Sitting, Cuff Size: Large)   Pulse 75   Temp 98.7 F (37.1 C)   Wt 186 lb 9 oz (84.6 kg)   SpO2 97%   BMI 35.84 kg/m    Subjective:    Patient ID: Jennifer Sanders, female    DOB: 11-Dec-1955, 62 y.o.   MRN: [id number]  HPI: [name] is a 62 y.o. female  Chief Complaint  Patient presents with  . Hypertension   HYPERTENSION Hypertension status: controlled  Satisfied with current treatment? yes Duration of hypertension: chronic BP monitoring frequency:  not checking BP medication side effects:  no Medication compliance: excellent compliance Previous BP meds: lisinopril-hctz Aspirin: no Recurrent headaches: no Visual changes: no Palpitations: no Dyspnea: no Chest pain: no Lower extremity edema: no Dizzy/lightheaded: no  Has been under a lot of stress recently as her husband is on hospice. She is sleeping OK, but will call if she is having more issues.   Relevant past medical, surgical, family and social history reviewed and updated as indicated. Interim medical history since our last visit reviewed. Allergies and medications reviewed and updated.  Review of Systems  Constitutional: Negative.   Respiratory: Negative.   Cardiovascular: Negative.   Psychiatric/Behavioral: Negative.     Per HPI unless specifically indicated above     Objective:    BP 115/78 (BP Location: Left Arm, Patient Position: Sitting, Cuff Size: Large)   Pulse 75   Temp 98.7 F (37.1 C)   Wt 186 lb 9 oz (84.6 kg)   SpO2 97%   BMI 35.84 kg/m   Wt Readings from Last 3 Encounters:  01/17/18 186 lb 9 oz (84.6 kg)  07/17/17 190 lb 5 oz (86.3 kg)  01/10/17 187 lb (84.8 kg)    Physical Exam  Constitutional: She is oriented to person, place, and time. She appears well-developed and well-nourished. No distress.  HENT:  Head: Normocephalic and atraumatic.  Right Ear: Hearing normal.  Left Ear: Hearing normal.  Nose: Nose normal.    Eyes: Conjunctivae and lids are normal. Right eye exhibits no discharge. Left eye exhibits no discharge. No scleral icterus.  Cardiovascular: Normal rate, regular rhythm, normal heart sounds and intact distal pulses. Exam reveals no gallop and no friction rub.  No murmur heard. Pulmonary/Chest: Effort normal and breath sounds normal. No respiratory distress. She has no wheezes. She has no rales. She exhibits no tenderness.  Musculoskeletal: Normal range of motion.  Neurological: She is alert and oriented to person, place, and time.  Skin: Skin is warm, dry and intact. No rash noted. She is not diaphoretic. No erythema. No pallor.  Psychiatric: She has a normal mood and affect. Her speech is normal and behavior is normal. Judgment and thought content normal. Cognition and memory are normal.  Nursing note and vitals reviewed.   Results for orders placed or performed in visit on 07/17/17  Microscopic Examination  Result Value Ref Range   WBC, UA 0-5 0 - 5 /hpf   RBC, UA None seen 0 - 2 /hpf   Epithelial Cells (non renal) 0-10 0 - 10 /hpf   Bacteria, UA None seen None seen/Few  CBC with Differential/Platelet  Result Value Ref Range   WBC 9.4 3.4 - 10.8 x10E3/uL   RBC 5.34 (H) 3.77 - 5.28 x10E6/uL   Hemoglobin 14.4 11.1 - 15.9 g/dL   Hematocrit 44.2 34.0 - 46.6 %   MCV 83 79 - 97 fL  MCH 27.0 26.6 - 33.0 pg   MCHC 32.6 31.5 - 35.7 g/dL   RDW 14.4 12.3 - 15.4 %   Platelets 266 150 - 379 x10E3/uL   Neutrophils 73 Not Estab. %   Lymphs 20 Not Estab. %   Monocytes 5 Not Estab. %   Eos 2 Not Estab. %   Basos 0 Not Estab. %   Neutrophils Absolute 6.9 1.4 - 7.0 x10E3/uL   Lymphocytes Absolute 1.9 0.7 - 3.1 x10E3/uL   Monocytes Absolute 0.4 0.1 - 0.9 x10E3/uL   EOS (ABSOLUTE) 0.2 0.0 - 0.4 x10E3/uL   Basophils Absolute 0.0 0.0 - 0.2 x10E3/uL   Immature Granulocytes 0 Not Estab. %   Immature Grans (Abs) 0.0 0.0 - 0.1 x10E3/uL  Comprehensive metabolic panel  Result Value Ref Range    Glucose 93 65 - 99 mg/dL   BUN 9 8 - 27 mg/dL   Creatinine, Ser 0.83 0.57 - 1.00 mg/dL   GFR calc non Af Amer 77 >59 mL/min/1.73   GFR calc Af Amer 89 >59 mL/min/1.73   BUN/Creatinine Ratio 11 (L) 12 - 28   Sodium 140 134 - 144 mmol/L   Potassium 4.0 3.5 - 5.2 mmol/L   Chloride 99 96 - 106 mmol/L   CO2 25 20 - 29 mmol/L   Calcium 9.5 8.7 - 10.3 mg/dL   Total Protein 7.3 6.0 - 8.5 g/dL   Albumin 4.4 3.6 - 4.8 g/dL   Globulin, Total 2.9 1.5 - 4.5 g/dL   Albumin/Globulin Ratio 1.5 1.2 - 2.2   Bilirubin Total 0.5 0.0 - 1.2 mg/dL   Alkaline Phosphatase 76 39 - 117 IU/L   AST 22 0 - 40 IU/L   ALT 14 0 - 32 IU/L  Lipid Panel w/o Chol/HDL Ratio  Result Value Ref Range   Cholesterol, Total 175 100 - 199 mg/dL   Triglycerides 115 0 - 149 mg/dL   HDL 58 >39 mg/dL   VLDL Cholesterol Cal 23 5 - 40 mg/dL   LDL Calculated 94 0 - 99 mg/dL  Microalbumin, Urine Waived  Result Value Ref Range   Microalb, Ur Waived 10 0 - 19 mg/L   Creatinine, Urine Waived 50 10 - 300 mg/dL   Microalb/Creat Ratio <30 <30 mg/g  TSH  Result Value Ref Range   TSH 2.240 0.450 - 4.500 uIU/mL  UA/M w/rflx Culture, Routine  Result Value Ref Range   Specific Gravity, UA 1.015 1.005 - 1.030   pH, UA 7.0 5.0 - 7.5   Color, UA Yellow Yellow   Appearance Ur Clear Clear   Leukocytes, UA Negative Negative   Protein, UA Negative Negative/Trace   Glucose, UA Negative Negative   Ketones, UA Negative Negative   RBC, UA Negative Negative   Bilirubin, UA Negative Negative   Urobilinogen, Ur 0.2 0.2 - 1.0 mg/dL   Nitrite, UA Negative Negative   Microscopic Examination See below:       Assessment & Plan:   Problem List Items Addressed This Visit      Cardiovascular and Mediastinum   Hypertension - Primary    Under good control. Continue current regimen. Refill given today. Call with any concerns.       Relevant Orders   Basic metabolic panel    Other Visit Diagnoses    Acute anxiety       Husband on hospice.  If she needs it, will call in short-term anxiety medicine.        Follow up plan: Return  in about 6 months (around 07/17/2018) for Physical.

## 2018-01-18 ENCOUNTER — Encounter: Payer: Self-pay | Admitting: Family Medicine

## 2018-01-18 LAB — BASIC METABOLIC PANEL
BUN/Creatinine Ratio: 10 — ABNORMAL LOW (ref 12–28)
BUN: 9 mg/dL (ref 8–27)
CALCIUM: 9.6 mg/dL (ref 8.7–10.3)
CO2: 25 mmol/L (ref 20–29)
CREATININE: 0.87 mg/dL (ref 0.57–1.00)
Chloride: 102 mmol/L (ref 96–106)
GFR calc Af Amer: 83 mL/min/{1.73_m2} (ref >59)
GFR, EST NON AFRICAN AMERICAN: 72 mL/min/{1.73_m2} (ref >59)
Glucose: 88 mg/dL (ref 65–99)
Potassium: 4 mmol/L (ref 3.5–5.2)
Sodium: 141 mmol/L (ref 134–144)

## 2018-05-04 ENCOUNTER — Encounter: Payer: Self-pay | Admitting: Family Medicine

## 2018-05-09 ENCOUNTER — Telehealth: Payer: Self-pay | Admitting: Family Medicine

## 2018-05-09 MED ORDER — LORAZEPAM 0.5 MG PO TABS: 0.5000 mg | ORAL_TABLET | Freq: Two times a day (BID) | ORAL | 0 refills | Status: DC | PRN

## 2018-05-09 NOTE — Telephone Encounter (Signed)
Rx sent to her pharmacy. Let me know if she needs anything.

## 2018-05-09 NOTE — Telephone Encounter (Signed)
Routing to provider to advise.  

## 2018-05-09 NOTE — Telephone Encounter (Signed)
DPR reviewed. LVM for patient that medication was ready at her pharmacy.

## 2018-05-09 NOTE — Telephone Encounter (Signed)
Patients husband passed away and she states Dr Wynetta Emery told her if she ever needed a script just to give her a call and she would call her something in. She would like something if possible sent to [place] or she is even willing to see Dr Wynetta Emery if this is necessary.  [phone]   Thank you

## 2018-05-10 ENCOUNTER — Telehealth: Payer: Self-pay | Admitting: Family Medicine

## 2018-05-10 NOTE — Telephone Encounter (Unsigned)
Copied from [place] [id number]. Topic: Inquiry >> May 10, 2018 11:12 AM Oliver Pila B wrote: Reason for CRM: pt called and wants to talk w/ pcp about a medication; pt did not give specifics but would like to talk w/ the pcp or the nurse  >> May 10, 2018  3:07 PM Neva Seat wrote: Pt returned missed NT call.  Please call pt back asap.

## 2018-05-10 NOTE — Telephone Encounter (Signed)
Copied from [place] [id number]. Topic: Inquiry >> May 10, 2018 11:12 AM Oliver Pila B wrote: Reason for CRM: pt called and wants to talk w/ pcp about a medication; pt did not give specifics but would like to talk w/ the pcp or the nurse about a abx for a heart murmur for her dentist

## 2018-05-10 NOTE — Telephone Encounter (Signed)
Duplicate encounter

## 2018-05-10 NOTE — Telephone Encounter (Signed)
Left message to call back- may speak to any nurse

## 2018-05-11 MED ORDER — AMOXICILLIN-POT CLAVULANATE 875-125 MG PO TABS: 1.0000 | ORAL_TABLET | Freq: Two times a day (BID) | ORAL | 0 refills | Status: DC

## 2018-05-11 NOTE — Telephone Encounter (Signed)
I can send in a Rx for the infected tooth, but don't see anything in her chart that would require her to have prophylactic antibiotics before dental procedures. She can have her dentist send me a note, but as of right now, I don't see a reason why that's necessary. Medicine for the tooth infection is at her pharmacy

## 2018-05-11 NOTE — Telephone Encounter (Signed)
No

## 2018-05-11 NOTE — Telephone Encounter (Signed)
Patient states that she has a heart murmur, is this a cause for an antibiotic.

## 2018-05-11 NOTE — Telephone Encounter (Signed)
Patient would like to know if you can write a note stating that you consent for her to have an antibiotic before any dental  procedures.   Also she thinks that she has an infected tooth that is bothering her now, she would like to know if you can give her something for that.  [employer]

## 2018-05-11 NOTE — Telephone Encounter (Signed)
Can you please check to see what she needs?

## 2018-05-11 NOTE — Telephone Encounter (Signed)
Patient notified

## 2018-07-06 ENCOUNTER — Other Ambulatory Visit: Payer: Self-pay | Admitting: Family Medicine

## 2018-07-17 ENCOUNTER — Ambulatory Visit: Payer: BLUE CROSS/BLUE SHIELD | Admitting: Family Medicine

## 2018-07-23 ENCOUNTER — Encounter: Payer: Self-pay | Admitting: Family Medicine

## 2018-07-23 ENCOUNTER — Ambulatory Visit (INDEPENDENT_AMBULATORY_CARE_PROVIDER_SITE_OTHER): Payer: BLUE CROSS/BLUE SHIELD | Admitting: Family Medicine

## 2018-07-23 VITALS — BP 117/82 | HR 79 | Temp 99.1°F | Wt 189.2 lb

## 2018-07-23 DIAGNOSIS — I10 Essential (primary) hypertension: Secondary | ICD-10-CM | POA: Diagnosis not present

## 2018-07-23 MED ORDER — LISINOPRIL-HYDROCHLOROTHIAZIDE 20-12.5 MG PO TABS: 1.0000 | ORAL_TABLET | Freq: Every day | ORAL | 1 refills | Status: DC

## 2018-07-23 NOTE — Progress Notes (Signed)
BP 117/82 (BP Location: Left Arm, Patient Position: Sitting, Cuff Size: Large)   Pulse 79   Temp 99.1 F (37.3 C)   Wt 189 lb 4 oz (85.8 kg)   SpO2 98%   BMI 36.35 kg/m    Subjective:    Patient ID: Jennifer Sanders, female    DOB: [birth year], 62 y.o.   MRN: [id number]  HPI: Jennifer Sanders is a 62 y.o. female  Chief Complaint  Patient presents with  . Hypertension   HYPERTENSION Hypertension status: controlled  Satisfied with current treatment? yes Duration of hypertension: chronic BP monitoring frequency:  not checking BP medication side effects:  no Medication compliance: excellent compliance Previous BP meds: lisinopril-hctz Aspirin: no Recurrent headaches: no Visual changes: no Palpitations: no Dyspnea: no Chest pain: no Lower extremity edema: no Dizzy/lightheaded: no  Relevant past medical, surgical, family and social history reviewed and updated as indicated. Interim medical history since our last visit reviewed. Allergies and medications reviewed and updated.  Review of Systems  Constitutional: Negative.   Respiratory: Negative.   Cardiovascular: Negative.   Psychiatric/Behavioral: Negative.     Per HPI unless specifically indicated above     Objective:    BP 117/82 (BP Location: Left Arm, Patient Position: Sitting, Cuff Size: Large)   Pulse 79   Temp 99.1 F (37.3 C)   Wt 189 lb 4 oz (85.8 kg)   SpO2 98%   BMI 36.35 kg/m   Wt Readings from Last 3 Encounters:  07/23/18 189 lb 4 oz (85.8 kg)  01/17/18 186 lb 9 oz (84.6 kg)  07/17/17 190 lb 5 oz (86.3 kg)    Physical Exam  Constitutional: She is oriented to person, place, and time. She appears well-developed and well-nourished. No distress.  HENT:  Head: Normocephalic and atraumatic.  Right Ear: Hearing normal.  Left Ear: Hearing normal.  Nose: Nose normal.  Eyes: Conjunctivae and lids are normal. Right eye exhibits no discharge. Left eye exhibits no discharge. No scleral icterus.    Cardiovascular: Normal rate, regular rhythm, normal heart sounds and intact distal pulses. Exam reveals no gallop and no friction rub.  No murmur heard. Pulmonary/Chest: Effort normal and breath sounds normal. No stridor. No respiratory distress. She has no wheezes. She has no rales. She exhibits no tenderness.  Musculoskeletal: Normal range of motion.  Neurological: She is alert and oriented to person, place, and time.  Skin: Skin is warm, dry and intact. Capillary refill takes less than 2 seconds. No rash noted. She is not diaphoretic. No erythema. No pallor.  Psychiatric: She has a normal mood and affect. Her speech is normal and behavior is normal. Judgment and thought content normal. Cognition and memory are normal.  Nursing note and vitals reviewed.   Results for orders placed or performed in visit on 90/30/09  Basic metabolic panel  Result Value Ref Range   Glucose 88 65 - 99 mg/dL   BUN 9 8 - 27 mg/dL   Creatinine, Ser 0.87 0.57 - 1.00 mg/dL   GFR calc non Af Amer 72 >59 mL/min/1.73   GFR calc Af Amer 83 >59 mL/min/1.73   BUN/Creatinine Ratio 10 (L) 12 - 28   Sodium 141 134 - 144 mmol/L   Potassium 4.0 3.5 - 5.2 mmol/L   Chloride 102 96 - 106 mmol/L   CO2 25 20 - 29 mmol/L   Calcium 9.6 8.7 - 10.3 mg/dL      Assessment & Plan:   Problem List Items Addressed  This Visit      Cardiovascular and Mediastinum   Hypertension - Primary    Under good control. Continue current regimen. Refills given. Call with any concerns.       Relevant Medications   lisinopril-hydrochlorothiazide (PRINZIDE,ZESTORETIC) 20-12.5 MG tablet   Other Relevant Orders   Basic metabolic panel       Follow up plan: Return in about 6 months (around 01/23/2019) for physical.

## 2018-07-23 NOTE — Assessment & Plan Note (Signed)
Under good control. Continue current regimen. Refills given. Call with any concerns.

## 2018-07-24 ENCOUNTER — Encounter: Payer: Self-pay | Admitting: Family Medicine

## 2018-07-24 LAB — BASIC METABOLIC PANEL
BUN / CREAT RATIO: 11 — AB (ref 12–28)
BUN: 10 mg/dL (ref 8–27)
CHLORIDE: 103 mmol/L (ref 96–106)
CO2: 23 mmol/L (ref 20–29)
Calcium: 9.5 mg/dL (ref 8.7–10.3)
Creatinine, Ser: 0.87 mg/dL (ref 0.57–1.00)
GFR, EST AFRICAN AMERICAN: 83 mL/min/{1.73_m2} (ref >59)
GFR, EST NON AFRICAN AMERICAN: 72 mL/min/{1.73_m2} (ref >59)
Glucose: 113 mg/dL — ABNORMAL HIGH (ref 65–99)
POTASSIUM: 3.9 mmol/L (ref 3.5–5.2)
SODIUM: 142 mmol/L (ref 134–144)

## 2018-07-27 LAB — HM MAMMOGRAPHY

## 2019-01-25 ENCOUNTER — Encounter: Payer: Self-pay | Admitting: Family Medicine

## 2019-01-25 ENCOUNTER — Other Ambulatory Visit (HOSPITAL_COMMUNITY)
Admission: RE | Admit: 2019-01-25 | Discharge: 2019-01-25 | Disposition: A | Discharge status: Home / Self Care | Payer: PRIVATE HEALTH INSURANCE | Source: Ambulatory Visit | Attending: Family Medicine | Admitting: Family Medicine

## 2019-01-25 ENCOUNTER — Other Ambulatory Visit: Payer: Self-pay

## 2019-01-25 ENCOUNTER — Ambulatory Visit (INDEPENDENT_AMBULATORY_CARE_PROVIDER_SITE_OTHER): Payer: PRIVATE HEALTH INSURANCE | Admitting: Family Medicine

## 2019-01-25 VITALS — BP 113/75 | HR 95 | Temp 98.9°F | Ht 61.2 in | Wt 193.0 lb

## 2019-01-25 DIAGNOSIS — I10 Essential (primary) hypertension: Secondary | ICD-10-CM | POA: Diagnosis not present

## 2019-01-25 DIAGNOSIS — Z0000 Encounter for general adult medical examination without abnormal findings: Secondary | ICD-10-CM | POA: Diagnosis not present

## 2019-01-25 DIAGNOSIS — Z124 Encounter for screening for malignant neoplasm of cervix: Secondary | ICD-10-CM | POA: Insufficient documentation

## 2019-01-25 LAB — UA/M W/RFLX CULTURE, ROUTINE
Bilirubin, UA: NEGATIVE
Glucose, UA: NEGATIVE
Ketones, UA: NEGATIVE
Leukocytes, UA: NEGATIVE
Nitrite, UA: NEGATIVE
PH UA: 5 (ref 5.0–7.5)
Protein, UA: NEGATIVE
RBC, UA: NEGATIVE
Specific Gravity, UA: 1.025 (ref 1.005–1.030)
Urobilinogen, Ur: 0.2 mg/dL (ref 0.2–1.0)

## 2019-01-25 LAB — MICROALBUMIN, URINE WAIVED
Creatinine, Urine Waived: 300 mg/dL (ref 10–300)
Microalb, Ur Waived: 10 mg/L (ref 0–19)

## 2019-01-25 NOTE — Progress Notes (Signed)
BP 113/75   Pulse 95   Temp 98.9 F (37.2 C) (Oral)   Ht 5' 1.2" (1.554 m)   Wt 193 lb (87.5 kg)   SpO2 95%   BMI 36.23 kg/m    Subjective:    Patient ID: Jennifer Sanders, female    DOB: July 04, 1956, 63 y.o.   MRN: [id number]  HPI: Jennifer Sanders is a 63 y.o. female presenting on 01/25/2019 for comprehensive medical examination. Current medical complaints include:  HYPERTENSION Hypertension status: controlled  Satisfied with current treatment? yes Duration of hypertension: chronic BP monitoring frequency:  not checking BP medication side effects:  no Medication compliance: excellent compliance Previous BP meds: Aspirin: no Recurrent headaches: no Visual changes: no Palpitations: no Dyspnea: no Chest pain: no Lower extremity edema: no Dizzy/lightheaded: no  Menopausal Symptoms: no  Depression Screen done today and results listed below:  Depression screen [facility] 2/9 01/25/2019 07/23/2018 07/17/2017 01/10/2017  Decreased Interest 0 0 0 0  Down, Depressed, Hopeless 0 0 1 0  PHQ - 2 Score 0 0 1 0  Altered sleeping 0 0 0 -  Tired, decreased energy 1 1 1  -  Change in appetite 0 0 0 -  Feeling bad or failure about yourself  0 0 0 -  Trouble concentrating 0 0 0 -  Moving slowly or fidgety/restless 0 0 0 -  Suicidal thoughts 0 0 0 -  PHQ-9 Score 1 1 2  -  Difficult doing work/chores Not difficult at all Not difficult at all Somewhat difficult -     Past Medical History:  Past Medical History:  Diagnosis Date  . Bilateral carpal tunnel syndrome   . Diverticulosis    found on colonoscopy on 05/24/16  . Hypertension   . Meralgia paraesthetica, left     Surgical History:  Past Surgical History:  Procedure Laterality Date  . CHOLECYSTECTOMY  1990s  . COLONOSCOPY  05/24/2016   internal hemorrhoids, diverticulosis    Medications:  Current Outpatient Medications on File Prior to Visit  Medication Sig  . desonide (DESOWEN) 0.05 % lotion Apply topically.  Marland Kitchen  lisinopril-hydrochlorothiazide (PRINZIDE,ZESTORETIC) 20-12.5 MG tablet Take 1 tablet by mouth daily.  Marland Kitchen pyridoxine (B-6) 100 MG tablet Take by mouth.   No current facility-administered medications on file prior to visit.     Allergies:  No Known Allergies  Social History:  Social History   Socioeconomic History  . Marital status: Married    Spouse name: Not on file  . Number of children: Not on file  . Years of education: Not on file  . Highest education level: Not on file  Occupational History  . Not on file  Social Needs  . Financial resource strain: Not on file  . Food insecurity:    Worry: Not on file    Inability: Not on file  . Transportation needs:    Medical: Not on file    Non-medical: Not on file  Tobacco Use  . Smoking status: Never Smoker  . Smokeless tobacco: Never Used  Substance and Sexual Activity  . Alcohol use: No  . Drug use: No  . Sexual activity: Yes  Lifestyle  . Physical activity:    Days per week: Not on file    Minutes per session: Not on file  . Stress: Not on file  Relationships  . Social connections:    Talks on phone: Not on file    Gets together: Not on file    Attends religious service: Not  on file    Active member of club or organization: Not on file    Attends meetings of clubs or organizations: Not on file    Relationship status: Not on file  . Intimate partner violence:    Fear of current or ex partner: Not on file    Emotionally abused: Not on file    Physically abused: Not on file    Forced sexual activity: Not on file  Other Topics Concern  . Not on file  Social History Narrative  . Not on file   Social History   Tobacco Use  Smoking Status Never Smoker  Smokeless Tobacco Never Used   Social History   Substance and Sexual Activity  Alcohol Use No    Family History:  Family History  Problem Relation Age of Onset  . Hyperlipidemia Mother   . Cancer Mother        lung  . Cancer Father        esophageal     Past medical history, surgical history, medications, allergies, family history and social history reviewed with patient today and changes made to appropriate areas of the chart.   Review of Systems  Constitutional: Negative.   HENT: Negative.   Eyes: Negative.   Respiratory: Negative.   Cardiovascular: Negative.   Gastrointestinal: Positive for diarrhea. Negative for abdominal pain, blood in stool, constipation, heartburn, melena, nausea and vomiting.  Genitourinary: Negative.   Musculoskeletal: Positive for joint pain (R shoulder pain). Negative for back pain, falls, myalgias and neck pain.  Skin: Negative.   Neurological: Positive for tingling. Negative for dizziness, tremors, sensory change, speech change, focal weakness, seizures, loss of consciousness, weakness and headaches.  Endo/Heme/Allergies: Negative for environmental allergies and polydipsia. Bruises/bleeds easily.  Psychiatric/Behavioral: Negative.     All other ROS negative except what is listed above and in the HPI.      Objective:    BP 113/75   Pulse 95   Temp 98.9 F (37.2 C) (Oral)   Ht 5' 1.2" (1.554 m)   Wt 193 lb (87.5 kg)   SpO2 95%   BMI 36.23 kg/m   Wt Readings from Last 3 Encounters:  01/25/19 193 lb (87.5 kg)  07/23/18 189 lb 4 oz (85.8 kg)  01/17/18 186 lb 9 oz (84.6 kg)    Physical Exam Vitals signs and nursing note reviewed.  Constitutional:      General: She is not in acute distress.    Appearance: Normal appearance. She is not ill-appearing, toxic-appearing or diaphoretic.  HENT:     Head: Normocephalic and atraumatic.     Right Ear: Tympanic membrane, ear canal and external ear normal. There is no impacted cerumen.     Left Ear: Tympanic membrane, ear canal and external ear normal. There is no impacted cerumen.     Nose: Nose normal. No congestion or rhinorrhea.     Mouth/Throat:     Mouth: Mucous membranes are moist.     Pharynx: Oropharynx is clear. No oropharyngeal exudate or  posterior oropharyngeal erythema.  Eyes:     General: No scleral icterus.       Right eye: No discharge.        Left eye: No discharge.     Extraocular Movements: Extraocular movements intact.     Conjunctiva/sclera: Conjunctivae normal.     Pupils: Pupils are equal, round, and reactive to light.  Neck:     Musculoskeletal: Normal range of motion and neck supple. No neck rigidity or  muscular tenderness.     Vascular: No carotid bruit.  Cardiovascular:     Rate and Rhythm: Normal rate and regular rhythm.     Pulses: Normal pulses.     Heart sounds: No murmur. No friction rub. No gallop.   Pulmonary:     Effort: Pulmonary effort is normal. No respiratory distress.     Breath sounds: Normal breath sounds. No stridor. No wheezing, rhonchi or rales.  Chest:     Chest wall: No tenderness.     Breasts:        Right: No swelling, bleeding, inverted nipple, mass, nipple discharge, skin change or tenderness.        Left: No swelling, bleeding, inverted nipple, mass, nipple discharge, skin change or tenderness.  Abdominal:     General: Abdomen is flat. Bowel sounds are normal. There is no distension.     Palpations: Abdomen is soft. There is no mass.     Tenderness: There is no abdominal tenderness. There is no right CVA tenderness, left CVA tenderness, guarding or rebound.     Hernia: No hernia is present. There is no hernia in the right inguinal area or left inguinal area.  Genitourinary:    General: Normal vulva.     Labia:        Right: No rash, tenderness, lesion or injury.        Left: No rash, tenderness, lesion or injury.      Vagina: No vaginal discharge.     Cervix: Normal.     Uterus: Normal.      Rectum: Normal.  Musculoskeletal:        General: No swelling, tenderness, deformity or signs of injury.     Right lower leg: No edema.     Left lower leg: No edema.  Lymphadenopathy:     Cervical: No cervical adenopathy.     Lower Body: No right inguinal adenopathy. No left  inguinal adenopathy.  Skin:    General: Skin is warm and dry.     Capillary Refill: Capillary refill takes less than 2 seconds.     Coloration: Skin is not jaundiced or pale.     Findings: No bruising, erythema, lesion or rash.  Neurological:     General: No focal deficit present.     Mental Status: She is alert and oriented to person, place, and time. Mental status is at baseline.     Cranial Nerves: No cranial nerve deficit.     Sensory: No sensory deficit.     Motor: No weakness.     Coordination: Coordination normal.     Gait: Gait normal.     Deep Tendon Reflexes: Reflexes normal.  Psychiatric:        Mood and Affect: Mood normal.        Behavior: Behavior normal.        Thought Content: Thought content normal.        Judgment: Judgment normal.     Results for orders placed or performed in visit on 01/25/19  CBC with Differential/Platelet  Result Value Ref Range   WBC 7.8 3.4 - 10.8 x10E3/uL   RBC 5.19 3.77 - 5.28 x10E6/uL   Hemoglobin 14.3 11.1 - 15.9 g/dL   Hematocrit 43.1 34.0 - 46.6 %   MCV 83 79 - 97 fL   MCH 27.6 26.6 - 33.0 pg   MCHC 33.2 31.5 - 35.7 g/dL   RDW 13.4 11.7 - 15.4 %   Platelets 254 150 - 450  x10E3/uL   Neutrophils 67 Not Estab. %   Lymphs 23 Not Estab. %   Monocytes 7 Not Estab. %   Eos 2 Not Estab. %   Basos 1 Not Estab. %   Neutrophils Absolute 5.2 1.4 - 7.0 x10E3/uL   Lymphocytes Absolute 1.8 0.7 - 3.1 x10E3/uL   Monocytes Absolute 0.6 0.1 - 0.9 x10E3/uL   EOS (ABSOLUTE) 0.2 0.0 - 0.4 x10E3/uL   Basophils Absolute 0.0 0.0 - 0.2 x10E3/uL   Immature Granulocytes 0 Not Estab. %   Immature Grans (Abs) 0.0 0.0 - 0.1 x10E3/uL  Comprehensive metabolic panel  Result Value Ref Range   Glucose 90 65 - 99 mg/dL   BUN 8 8 - 27 mg/dL   Creatinine, Ser 0.90 0.57 - 1.00 mg/dL   GFR calc non Af Amer 69 >59 mL/min/1.73   GFR calc Af Amer 79 >59 mL/min/1.73   BUN/Creatinine Ratio 9 (L) 12 - 28   Sodium 140 134 - 144 mmol/L   Potassium 4.4 3.5 - 5.2  mmol/L   Chloride 101 96 - 106 mmol/L   CO2 26 20 - 29 mmol/L   Calcium 10.1 8.7 - 10.3 mg/dL   Total Protein 7.3 6.0 - 8.5 g/dL   Albumin 4.3 3.8 - 4.8 g/dL   Globulin, Total 3.0 1.5 - 4.5 g/dL   Albumin/Globulin Ratio 1.4 1.2 - 2.2   Bilirubin Total 0.5 0.0 - 1.2 mg/dL   Alkaline Phosphatase 74 39 - 117 IU/L   AST 23 0 - 40 IU/L   ALT 18 0 - 32 IU/L  Lipid Panel w/o Chol/HDL Ratio  Result Value Ref Range   Cholesterol, Total 201 (H) 100 - 199 mg/dL   Triglycerides 122 0 - 149 mg/dL   HDL 60 >39 mg/dL   VLDL Cholesterol Cal 24 5 - 40 mg/dL   LDL Calculated 117 (H) 0 - 99 mg/dL  Microalbumin, Urine Waived  Result Value Ref Range   Microalb, Ur Waived 10 0 - 19 mg/L   Creatinine, Urine Waived 300 10 - 300 mg/dL   Microalb/Creat Ratio <30 <30 mg/g  TSH  Result Value Ref Range   TSH 2.170 0.450 - 4.500 uIU/mL  UA/M w/rflx Culture, Routine  Result Value Ref Range   Specific Gravity, UA 1.025 1.005 - 1.030   pH, UA 5.0 5.0 - 7.5   Color, UA Yellow Yellow   Appearance Ur Clear Clear   Leukocytes, UA Negative Negative   Protein, UA Negative Negative/Trace   Glucose, UA Negative Negative   Ketones, UA Negative Negative   RBC, UA Negative Negative   Bilirubin, UA Negative Negative   Urobilinogen, Ur 0.2 0.2 - 1.0 mg/dL   Nitrite, UA Negative Negative      Assessment & Plan:   Problem List Items Addressed This Visit      Cardiovascular and Mediastinum   Hypertension    Under good control on current regimen. Continue current regimen. Continue to monitor. Refills given today. Call with any concerns.       Relevant Orders   Comprehensive metabolic panel (Completed)   Microalbumin, Urine Waived (Completed)    Other Visit Diagnoses    Routine general medical examination at a health care facility    -  Primary   Vaccines up to date. Screening labs checked today. Pap done. Mammogram and colonoscopy up to date. Continue diet and exercise. Call with any concerns.    Relevant  Orders   CBC with Differential/Platelet (Completed)   Comprehensive  metabolic panel (Completed)   Lipid Panel w/o Chol/HDL Ratio (Completed)   Microalbumin, Urine Waived (Completed)   TSH (Completed)   UA/M w/rflx Culture, Routine (Completed)   Screening for cervical cancer       Pap done today.   Relevant Orders   Cytology - PAP       Follow up plan: Return in about 6 months (around 07/26/2019) for Follow up BP.   LABORATORY TESTING:  - Pap smear: pap done  IMMUNIZATIONS:   - Tdap: Tetanus vaccination status reviewed: last tetanus booster within 10 years. - Influenza: Up to date - Pneumovax: Not applicable - Prevnar: Not applicable  SCREENING: -Mammogram: Up to date  - Colonoscopy: Up to date   PATIENT COUNSELING:   Advised to take 1 mg of folate supplement per day if capable of pregnancy.   Sexuality: Discussed sexually transmitted diseases, partner selection, use of condoms, avoidance of unintended pregnancy  and contraceptive alternatives.   Advised to avoid cigarette smoking.  I discussed with the patient that most people either abstain from alcohol or drink within safe limits (<=14/week and <=4 drinks/occasion for males, <=7/weeks and <= 3 drinks/occasion for females) and that the risk for alcohol disorders and other health effects rises proportionally with the number of drinks per week and how often a drinker exceeds daily limits.  Discussed cessation/primary prevention of drug use and availability of treatment for abuse.   Diet: Encouraged to adjust caloric intake to maintain  or achieve ideal body weight, to reduce intake of dietary saturated fat and total fat, to limit sodium intake by avoiding high sodium foods and not adding table salt, and to maintain adequate dietary potassium and calcium preferably from fresh fruits, vegetables, and low-fat dairy products.    stressed the importance of regular exercise  Injury prevention: Discussed safety belts, safety  helmets, smoke detector, smoking near bedding or upholstery.   Dental health: Discussed importance of regular tooth brushing, flossing, and dental visits.    NEXT PREVENTATIVE PHYSICAL DUE IN 1 YEAR. Return in about 6 months (around 07/26/2019) for Follow up BP.

## 2019-01-25 NOTE — Patient Instructions (Signed)
Health Maintenance, Female Adopting a healthy lifestyle and getting preventive care can go a long way to promote health and wellness. Talk with your health care provider about what schedule of regular examinations is right for you. This is a good chance for you to check in with your provider about disease prevention and staying healthy. In between checkups, there are plenty of things you can do on your own. Experts have done a lot of research about which lifestyle changes and preventive measures are most likely to keep you healthy. Ask your health care provider for more information. Weight and diet Eat a healthy diet  Be sure to include plenty of vegetables, fruits, low-fat dairy products, and lean protein.  Do not eat a lot of foods high in solid fats, added sugars, or salt.  Get regular exercise. This is one of the most important things you can do for your health. ? Most adults should exercise for at least 150 minutes each week. The exercise should increase your heart rate and make you sweat (moderate-intensity exercise). ? Most adults should also do strengthening exercises at least twice a week. This is in addition to the moderate-intensity exercise. Maintain a healthy weight  Body mass index (BMI) is a measurement that can be used to identify possible weight problems. It estimates body fat based on height and weight. Your health care provider can help determine your BMI and help you achieve or maintain a healthy weight.  For females 20 years of age and older: ? A BMI below 18.5 is considered underweight. ? A BMI of 18.5 to 24.9 is normal. ? A BMI of 25 to 29.9 is considered overweight. ? A BMI of 30 and above is considered obese. Watch levels of cholesterol and blood lipids  You should start having your blood tested for lipids and cholesterol at 63 years of age, then have this test every 5 years.  You may need to have your cholesterol levels checked more often if: ? Your lipid or  cholesterol levels are high. ? You are older than 63 years of age. ? You are at high risk for heart disease. Cancer screening Lung Cancer  Lung cancer screening is recommended for adults 55-80 years old who are at high risk for lung cancer because of a history of smoking.  A yearly low-dose CT scan of the lungs is recommended for people who: ? Currently smoke. ? Have quit within the past 15 years. ? Have at least a 30-pack-year history of smoking. A pack year is smoking an average of one pack of cigarettes a day for 1 year.  Yearly screening should continue until it has been 15 years since you quit.  Yearly screening should stop if you develop a health problem that would prevent you from having lung cancer treatment. Breast Cancer  Practice breast self-awareness. This means understanding how your breasts normally appear and feel.  It also means doing regular breast self-exams. Let your health care provider know about any changes, no matter how small.  If you are in your 20s or 30s, you should have a clinical breast exam (CBE) by a health care provider every 1-3 years as part of a regular health exam.  If you are 40 or older, have a CBE every year. Also consider having a breast X-ray (mammogram) every year.  If you have a family history of breast cancer, talk to your health care provider about genetic screening.  If you are at high risk for breast cancer, talk   to your health care provider about having an MRI and a mammogram every year.  Breast cancer gene (BRCA) assessment is recommended for women who have family members with BRCA-related cancers. BRCA-related cancers include: ? Breast. ? Ovarian. ? Tubal. ? Peritoneal cancers.  Results of the assessment will determine the need for genetic counseling and BRCA1 and BRCA2 testing. Cervical Cancer Your health care provider may recommend that you be screened regularly for cancer of the pelvic organs (ovaries, uterus, and vagina).  This screening involves a pelvic examination, including checking for microscopic changes to the surface of your cervix (Pap test). You may be encouraged to have this screening done every 3 years, beginning at age 21.  For women ages 30-65, health care providers may recommend pelvic exams and Pap testing every 3 years, or they may recommend the Pap and pelvic exam, combined with testing for human papilloma virus (HPV), every 5 years. Some types of HPV increase your risk of cervical cancer. Testing for HPV may also be done on women of any age with unclear Pap test results.  Other health care providers may not recommend any screening for nonpregnant women who are considered low risk for pelvic cancer and who do not have symptoms. Ask your health care provider if a screening pelvic exam is right for you.  If you have had past treatment for cervical cancer or a condition that could lead to cancer, you need Pap tests and screening for cancer for at least 20 years after your treatment. If Pap tests have been discontinued, your risk factors (such as having a new sexual partner) need to be reassessed to determine if screening should resume. Some women have medical problems that increase the chance of getting cervical cancer. In these cases, your health care provider may recommend more frequent screening and Pap tests. Colorectal Cancer  This type of cancer can be detected and often prevented.  Routine colorectal cancer screening usually begins at 63 years of age and continues through 63 years of age.  Your health care provider may recommend screening at an earlier age if you have risk factors for colon cancer.  Your health care provider may also recommend using home test kits to check for hidden blood in the stool.  A small camera at the end of a tube can be used to examine your colon directly (sigmoidoscopy or colonoscopy). This is done to check for the earliest forms of colorectal cancer.  Routine  screening usually begins at age 50.  Direct examination of the colon should be repeated every 5-10 years through 63 years of age. However, you may need to be screened more often if early forms of precancerous polyps or small growths are found. Skin Cancer  Check your skin from head to toe regularly.  Tell your health care provider about any new moles or changes in moles, especially if there is a change in a mole's shape or color.  Also tell your health care provider if you have a mole that is larger than the size of a pencil eraser.  Always use sunscreen. Apply sunscreen liberally and repeatedly throughout the day.  Protect yourself by wearing long sleeves, pants, a wide-brimmed hat, and sunglasses whenever you are outside. Heart disease, diabetes, and high blood pressure  High blood pressure causes heart disease and increases the risk of stroke. High blood pressure is more likely to develop in: ? People who have blood pressure in the high end of the normal range (130-139/85-89 mm Hg). ? People   who are overweight or obese. ? People who are African American.  If you are 84-22 years of age, have your blood pressure checked every 3-5 years. If you are 67 years of age or older, have your blood pressure checked every year. You should have your blood pressure measured twice-once when you are at a hospital or clinic, and once when you are not at a hospital or clinic. Record the average of the two measurements. To check your blood pressure when you are not at a hospital or clinic, you can use: ? An automated blood pressure machine at a pharmacy. ? A home blood pressure monitor.  If you are between 52 years and 3 years old, ask your health care provider if you should take aspirin to prevent strokes.  Have regular diabetes screenings. This involves taking a blood sample to check your fasting blood sugar level. ? If you are at a normal weight and have a low risk for diabetes, have this test once  every three years after 63 years of age. ? If you are overweight and have a high risk for diabetes, consider being tested at a younger age or more often. Preventing infection Hepatitis B  If you have a higher risk for hepatitis B, you should be screened for this virus. You are considered at high risk for hepatitis B if: ? You were born in a country where hepatitis B is common. Ask your health care provider which countries are considered high risk. ? Your parents were born in a high-risk country, and you have not been immunized against hepatitis B (hepatitis B vaccine). ? You have HIV or AIDS. ? You use needles to inject street drugs. ? You live with someone who has hepatitis B. ? You have had sex with someone who has hepatitis B. ? You get hemodialysis treatment. ? You take certain medicines for conditions, including cancer, organ transplantation, and autoimmune conditions. Hepatitis C  Blood testing is recommended for: ? Everyone born from [birth year] through 1965. ? Anyone with known risk factors for hepatitis C. Sexually transmitted infections (STIs)  You should be screened for sexually transmitted infections (STIs) including gonorrhea and chlamydia if: ? You are sexually active and are younger than 63 years of age. ? You are older than 63 years of age and your health care provider tells you that you are at risk for this type of infection. ? Your sexual activity has changed since you were last screened and you are at an increased risk for chlamydia or gonorrhea. Ask your health care provider if you are at risk.  If you do not have HIV, but are at risk, it may be recommended that you take a prescription medicine daily to prevent HIV infection. This is called pre-exposure prophylaxis (PrEP). You are considered at risk if: ? You are sexually active and do not regularly use condoms or know the HIV status of your partner(s). ? You take drugs by injection. ? You are sexually active with a partner  who has HIV. Talk with your health care provider about whether you are at high risk of being infected with HIV. If you choose to begin PrEP, you should first be tested for HIV. You should then be tested every 3 months for as long as you are taking PrEP. Pregnancy  If you are premenopausal and you may become pregnant, ask your health care provider about preconception counseling.  If you may become pregnant, take 400 to 800 micrograms (mcg) of folic acid every  day.  If you want to prevent pregnancy, talk to your health care provider about birth control (contraception). Osteoporosis and menopause  Osteoporosis is a disease in which the bones lose minerals and strength with aging. This can result in serious bone fractures. Your risk for osteoporosis can be identified using a bone density scan.  If you are 30 years of age or older, or if you are at risk for osteoporosis and fractures, ask your health care provider if you should be screened.  Ask your health care provider whether you should take a calcium or vitamin D supplement to lower your risk for osteoporosis.  Menopause may have certain physical symptoms and risks.  Hormone replacement therapy may reduce some of these symptoms and risks. Talk to your health care provider about whether hormone replacement therapy is right for you. Follow these instructions at home:  Schedule regular health, dental, and eye exams.  Stay current with your immunizations.  Do not use any tobacco products including cigarettes, chewing tobacco, or electronic cigarettes.  If you are pregnant, do not drink alcohol.  If you are breastfeeding, limit how much and how often you drink alcohol.  Limit alcohol intake to no more than 1 drink per day for nonpregnant women. One drink equals 12 ounces of beer, 5 ounces of wine, or 1 ounces of hard liquor.  Do not use street drugs.  Do not share needles.  Ask your health care provider for help if you need support  or information about quitting drugs.  Tell your health care provider if you often feel depressed.  Tell your health care provider if you have ever been abused or do not feel safe at home. This information is not intended to replace advice given to you by your health care provider. Make sure you discuss any questions you have with your health care provider. Document Released: 05/30/2011 Document Revised: 04/21/2016 Document Reviewed: 08/18/2015 Elsevier Interactive Patient Education  2019 [employer]  Shoulder Exercises Ask your health care provider which exercises are safe for you. Do exercises exactly as told by your health care provider and adjust them as directed. It is normal to feel mild stretching, pulling, tightness, or discomfort as you do these exercises, but you should stop right away if you feel sudden pain or your pain gets worse.Do not begin these exercises until told by your health care provider. Range of Motion Exercises        These exercises warm up your muscles and joints and improve the movement and flexibility of your shoulder. These exercises also help to relieve pain, numbness, and tingling. These exercises involve stretching your injured shoulder directly. Exercise A: Pendulum 1. Stand near a wall or a surface that you can hold onto for balance. 2. Bend at the waist and let your left / right arm hang straight down. Use your other arm to support you. Keep your back straight and do not lock your knees. 3. Relax your left / right arm and shoulder muscles, and move your hips and your trunk so your left / right arm swings freely. Your arm should swing because of the motion of your body, not because you are using your arm or shoulder muscles. 4. Keep moving your body so your arm swings in the following directions, as told by your health care provider: ? Side to side. ? Forward and backward. ? In clockwise and counterclockwise circles. 5. Continue each motion for  __________ seconds, or for as long as told by your  health care provider. 6. Slowly return to the starting position. Repeat __________ times. Complete this exercise __________ times a day. Exercise B:Flexion, Standing 1. Stand and hold a broomstick, a cane, or a similar object. Place your hands a little more than shoulder-width apart on the object. Your left / right hand should be palm-up, and your other hand should be palm-down. 2. Keep your elbow straight and keep your shoulder muscles relaxed. Push the stick down with your healthy arm to raise your left / right arm in front of your body, and then over your head until you feel a stretch in your shoulder. ? Avoid shrugging your shoulder while you raise your arm. Keep your shoulder blade tucked down toward the middle of your back. 3. Hold for __________ seconds. 4. Slowly return to the starting position. Repeat __________ times. Complete this exercise __________ times a day. Exercise C: Abduction, Standing 1. Stand and hold a broomstick, a cane, or a similar object. Place your hands a little more than shoulder-width apart on the object. Your left / right hand should be palm-up, and your other hand should be palm-down. 2. While keeping your elbow straight and your shoulder muscles relaxed, push the stick across your body toward your left / right side. Raise your left / right arm to the side of your body and then over your head until you feel a stretch in your shoulder. ? Do not raise your arm above shoulder height, unless your health care provider tells you to do that. ? Avoid shrugging your shoulder while you raise your arm. Keep your shoulder blade tucked down toward the middle of your back. 3. Hold for __________ seconds. 4. Slowly return to the starting position. Repeat __________ times. Complete this exercise __________ times a day. Exercise D:Internal Rotation 1. Place your left / right hand behind your back, palm-up. 2. Use your other hand  to dangle an exercise band, a towel, or a similar object over your shoulder. Grasp the band with your left / right hand so you are holding onto both ends. 3. Gently pull up on the band until you feel a stretch in the front of your left / right shoulder. ? Avoid shrugging your shoulder while you raise your arm. Keep your shoulder blade tucked down toward the middle of your back. 4. Hold for __________ seconds. 5. Release the stretch by letting go of the band and lowering your hands. Repeat __________ times. Complete this exercise __________ times a day. Stretching Exercises  These exercises warm up your muscles and joints and improve the movement and flexibility of your shoulder. These exercises also help to relieve pain, numbness, and tingling. These exercises are done using your healthy shoulder to help stretch the muscles of your injured shoulder. Exercise E: Warehouse manager (External Rotation and Abduction) 1. Stand in a doorway with one of your feet slightly in front of the other. This is called a staggered stance. If you cannot reach your forearms to the door frame, stand facing a corner of a room. 2. Choose one of the following positions as told by your health care provider: ? Place your hands and forearms on the door frame above your head. ? Place your hands and forearms on the door frame at the height of your head. ? Place your hands on the door frame at the height of your elbows. 3. Slowly move your weight onto your front foot until you feel a stretch across your chest and in the front of your  shoulders. Keep your head and chest upright and keep your abdominal muscles tight. 4. Hold for __________ seconds. 5. To release the stretch, shift your weight to your back foot. Repeat __________ times. Complete this stretch __________ times a day. Exercise F:Extension, Standing 1. Stand and hold a broomstick, a cane, or a similar object behind your back. ? Your hands should be a little wider  than shoulder-width apart. ? Your palms should face away from your back. 2. Keeping your elbows straight and keeping your shoulder muscles relaxed, move the stick away from your body until you feel a stretch in your shoulder. ? Avoid shrugging your shoulders while you move the stick. Keep your shoulder blade tucked down toward the middle of your back. 3. Hold for __________ seconds. 4. Slowly return to the starting position. Repeat __________ times. Complete this exercise __________ times a day. Strengthening Exercises           These exercises build strength and endurance in your shoulder. Endurance is the ability to use your muscles for a long time, even after they get tired. Exercise G:External Rotation 1. Sit in a stable chair without armrests. 2. Secure an exercise band at elbow height on your left / right side. 3. Place a soft object, such as a folded towel or a small pillow, between your left / right upper arm and your body to move your elbow a few inches away (about 10 cm) from your side. 4. Hold the end of the band so it is tight and there is no slack. 5. Keeping your elbow pressed against the soft object, move your left / right forearm out, away from your abdomen. Keep your body steady so only your forearm moves. 6. Hold for __________ seconds. 7. Slowly return to the starting position. Repeat __________ times. Complete this exercise __________ times a day. Exercise H:Shoulder Abduction 1. Sit in a stable chair without armrests, or stand. 2. Hold a __________ weight in your left / right hand, or hold an exercise band with both hands. 3. Start with your arms straight down and your left / right palm facing in, toward your body. 4. Slowly lift your left / right hand out to your side. Do not lift your hand above shoulder height unless your health care provider tells you that this is safe. ? Keep your arms straight. ? Avoid shrugging your shoulder while you do this movement.  Keep your shoulder blade tucked down toward the middle of your back. 5. Hold for __________ seconds. 6. Slowly lower your arm, and return to the starting position. Repeat __________ times. Complete this exercise __________ times a day. Exercise I:Shoulder Extension 1. Sit in a stable chair without armrests, or stand. 2. Secure an exercise band to a stable object in front of you where it is at shoulder height. 3. Hold one end of the exercise band in each hand. Your palms should face each other. 4. Straighten your elbows and lift your hands up to shoulder height. 5. Step back, away from the secured end of the exercise band, until the band is tight and there is no slack. 6. Squeeze your shoulder blades together as you pull your hands down to the sides of your thighs. Stop when your hands are straight down by your sides. Do not let your hands go behind your body. 7. Hold for __________ seconds. 8. Slowly return to the starting position. Repeat __________ times. Complete this exercise __________ times a day. Exercise J:Standing Shoulder Row 1. Sit  in a stable chair without armrests, or stand. 2. Secure an exercise band to a stable object in front of you so it is at waist height. 3. Hold one end of the exercise band in each hand. Your palms should be in a thumbs-up position. 4. Bend each of your elbows to an "L" shape (about 90 degrees) and keep your upper arms at your sides. 5. Step back until the band is tight and there is no slack. 6. Slowly pull your elbows back behind you. 7. Hold for __________ seconds. 8. Slowly return to the starting position. Repeat __________ times. Complete this exercise __________ times a day. Exercise K:Shoulder Press-Ups 1. Sit in a stable chair that has armrests. Sit upright, with your feet flat on the floor. 2. Put your hands on the armrests so your elbows are bent and your fingers are pointing forward. Your hands should be about even with the sides of your  body. 3. Push down on the armrests and use your arms to lift yourself off of the chair. Straighten your elbows and lift yourself up as much as you comfortably can. ? Move your shoulder blades down, and avoid letting your shoulders move up toward your ears. ? Keep your feet on the ground. As you get stronger, your feet should support less of your body weight as you lift yourself up. 4. Hold for __________ seconds. 5. Slowly lower yourself back into the chair. Repeat __________ times. Complete this exercise __________ times a day. Exercise L: Wall Push-Ups 1. Stand so you are facing a stable wall. Your feet should be about one arm-length away from the wall. 2. Lean forward and place your palms on the wall at shoulder height. 3. Keep your feet flat on the floor as you bend your elbows and lean forward toward the wall. 4. Hold for __________ seconds. 5. Straighten your elbows to push yourself back to the starting position. Repeat __________ times. Complete this exercise __________ times a day. This information is not intended to replace advice given to you by your health care provider. Make sure you discuss any questions you have with your health care provider. Document Released: 09/28/2005 Document Revised: 03/20/2018 Document Reviewed: 07/26/2015 Elsevier Interactive Patient Education  2019 [employer].

## 2019-01-26 LAB — CBC WITH DIFFERENTIAL/PLATELET
BASOS: 1 %
Basophils Absolute: 0 10*3/uL (ref 0.0–0.2)
EOS (ABSOLUTE): 0.2 10*3/uL (ref 0.0–0.4)
EOS: 2 %
HEMATOCRIT: 43.1 % (ref 34.0–46.6)
Hemoglobin: 14.3 g/dL (ref 11.1–15.9)
IMMATURE GRANS (ABS): 0 10*3/uL (ref 0.0–0.1)
Immature Granulocytes: 0 %
LYMPHS: 23 %
Lymphocytes Absolute: 1.8 10*3/uL (ref 0.7–3.1)
MCH: 27.6 pg (ref 26.6–33.0)
MCHC: 33.2 g/dL (ref 31.5–35.7)
MCV: 83 fL (ref 79–97)
Monocytes Absolute: 0.6 10*3/uL (ref 0.1–0.9)
Monocytes: 7 %
NEUTROS ABS: 5.2 10*3/uL (ref 1.4–7.0)
Neutrophils: 67 %
Platelets: 254 10*3/uL (ref 150–450)
RBC: 5.19 x10E6/uL (ref 3.77–5.28)
RDW: 13.4 % (ref 11.7–15.4)
WBC: 7.8 10*3/uL (ref 3.4–10.8)

## 2019-01-26 LAB — COMPREHENSIVE METABOLIC PANEL
A/G RATIO: 1.4 (ref 1.2–2.2)
ALK PHOS: 74 IU/L (ref 39–117)
ALT: 18 IU/L (ref 0–32)
AST: 23 IU/L (ref 0–40)
Albumin: 4.3 g/dL (ref 3.8–4.8)
BUN/Creatinine Ratio: 9 — ABNORMAL LOW (ref 12–28)
BUN: 8 mg/dL (ref 8–27)
Bilirubin Total: 0.5 mg/dL (ref 0.0–1.2)
CO2: 26 mmol/L (ref 20–29)
Calcium: 10.1 mg/dL (ref 8.7–10.3)
Chloride: 101 mmol/L (ref 96–106)
Creatinine, Ser: 0.9 mg/dL (ref 0.57–1.00)
GFR calc Af Amer: 79 mL/min/{1.73_m2} (ref >59)
GFR calc non Af Amer: 69 mL/min/{1.73_m2} (ref >59)
GLOBULIN, TOTAL: 3 g/dL (ref 1.5–4.5)
Glucose: 90 mg/dL (ref 65–99)
POTASSIUM: 4.4 mmol/L (ref 3.5–5.2)
SODIUM: 140 mmol/L (ref 134–144)
Total Protein: 7.3 g/dL (ref 6.0–8.5)

## 2019-01-26 LAB — LIPID PANEL W/O CHOL/HDL RATIO
CHOLESTEROL TOTAL: 201 mg/dL — AB (ref 100–199)
HDL: 60 mg/dL (ref >39)
LDL Calculated: 117 mg/dL — ABNORMAL HIGH (ref 0–99)
TRIGLYCERIDES: 122 mg/dL (ref 0–149)
VLDL Cholesterol Cal: 24 mg/dL (ref 5–40)

## 2019-01-26 LAB — TSH: TSH: 2.17 u[IU]/mL (ref 0.450–4.500)

## 2019-01-27 ENCOUNTER — Encounter: Payer: Self-pay | Admitting: Family Medicine

## 2019-01-27 NOTE — Assessment & Plan Note (Signed)
Under good control on current regimen. Continue current regimen. Continue to monitor. Refills given today. Call with any concerns.

## 2019-01-28 ENCOUNTER — Encounter: Payer: Self-pay | Admitting: Family Medicine

## 2019-01-28 LAB — CYTOLOGY - PAP
Diagnosis: NEGATIVE
HPV: NOT DETECTED

## 2019-07-24 ENCOUNTER — Telehealth: Payer: Self-pay | Admitting: Family Medicine

## 2019-07-24 NOTE — Telephone Encounter (Signed)
She can get both of those after her appointment in case there's something we discuss that we didn't plan too and need to change the lab order.

## 2019-07-24 NOTE — Telephone Encounter (Signed)
Called pt to set up virtual per her request, she is able to get her vitals and has a smart phone. Pt is wanting to know if any labs can be put in prior to her appt Friday. She is also wanting a flu shot, however she only wants to come in for labs and shot. Please advise.

## 2019-07-25 NOTE — Telephone Encounter (Signed)
Pt.notified

## 2019-07-26 ENCOUNTER — Other Ambulatory Visit: Payer: Self-pay

## 2019-07-26 ENCOUNTER — Encounter: Payer: Self-pay | Admitting: Family Medicine

## 2019-07-26 ENCOUNTER — Ambulatory Visit (INDEPENDENT_AMBULATORY_CARE_PROVIDER_SITE_OTHER): Payer: PRIVATE HEALTH INSURANCE | Admitting: Family Medicine

## 2019-07-26 VITALS — BP 130/79 | HR 80 | Temp 96.9°F | Wt 181.0 lb

## 2019-07-26 DIAGNOSIS — I10 Essential (primary) hypertension: Secondary | ICD-10-CM

## 2019-07-26 MED ORDER — LISINOPRIL-HYDROCHLOROTHIAZIDE 20-12.5 MG PO TABS: 1.0000 | ORAL_TABLET | Freq: Every day | ORAL | 1 refills | Status: DC

## 2019-07-26 NOTE — Progress Notes (Signed)
BP 130/79   Pulse 80   Temp (!) 96.9 F (36.1 C)   Wt 181 lb (82.1 kg)   BMI 33.98 kg/m    Subjective:    Patient ID: Jennifer Sanders, female    DOB: 1956/07/09, 64 y.o.   MRN: [id number]  HPI: Jennifer Sanders is a 63 y.o. female  Chief Complaint  Patient presents with  . Hypertension   HYPERTENSION Hypertension status: controlled  Satisfied with current treatment? yes Duration of hypertension: chronic BP monitoring frequency:  not checking BP medication side effects:  no Medication compliance: excellent compliance Previous BP meds: lisinopril-HCTZ Aspirin: no Recurrent headaches: no Visual changes: no Palpitations: no Dyspnea: no Chest pain: no Lower extremity edema: no Dizzy/lightheaded: no  Relevant past medical, surgical, family and social history reviewed and updated as indicated. Interim medical history since our last visit reviewed. Allergies and medications reviewed and updated.  Review of Systems  Constitutional: Negative.   Respiratory: Negative.   Cardiovascular: Negative.   Gastrointestinal: Negative.   Psychiatric/Behavioral: Negative.     Per HPI unless specifically indicated above     Objective:    BP 130/79   Pulse 80   Temp (!) 96.9 F (36.1 C)   Wt 181 lb (82.1 kg)   BMI 33.98 kg/m   Wt Readings from Last 3 Encounters:  07/26/19 181 lb (82.1 kg)  01/25/19 193 lb (87.5 kg)  07/23/18 189 lb 4 oz (85.8 kg)    Physical Exam Vitals signs and nursing note reviewed.  Constitutional:      General: She is not in acute distress.    Appearance: Normal appearance. She is not ill-appearing, toxic-appearing or diaphoretic.  HENT:     Head: Normocephalic and atraumatic.     Right Ear: External ear normal.     Left Ear: External ear normal.     Nose: Nose normal.     Mouth/Throat:     Mouth: Mucous membranes are moist.     Pharynx: Oropharynx is clear.  Eyes:     General: No scleral icterus.       Right eye: No discharge.      Left eye: No discharge.     Conjunctiva/sclera: Conjunctivae normal.     Pupils: Pupils are equal, round, and reactive to light.  Neck:     Musculoskeletal: Normal range of motion.  Pulmonary:     Effort: Pulmonary effort is normal. No respiratory distress.     Comments: Speaking in full sentences Musculoskeletal: Normal range of motion.  Skin:    Coloration: Skin is not jaundiced or pale.     Findings: No bruising, erythema, lesion or rash.  Neurological:     Mental Status: She is alert and oriented to person, place, and time. Mental status is at baseline.  Psychiatric:        Mood and Affect: Mood normal.        Behavior: Behavior normal.        Thought Content: Thought content normal.        Judgment: Judgment normal.     Results for orders placed or performed in visit on 01/25/19  CBC with Differential/Platelet  Result Value Ref Range   WBC 7.8 3.4 - 10.8 x10E3/uL   RBC 5.19 3.77 - 5.28 x10E6/uL   Hemoglobin 14.3 11.1 - 15.9 g/dL   Hematocrit 43.1 34.0 - 46.6 %   MCV 83 79 - 97 fL   MCH 27.6 26.6 - 33.0 pg   MCHC  33.2 31.5 - 35.7 g/dL   RDW 13.4 11.7 - 15.4 %   Platelets 254 150 - 450 x10E3/uL   Neutrophils 67 Not Estab. %   Lymphs 23 Not Estab. %   Monocytes 7 Not Estab. %   Eos 2 Not Estab. %   Basos 1 Not Estab. %   Neutrophils Absolute 5.2 1.4 - 7.0 x10E3/uL   Lymphocytes Absolute 1.8 0.7 - 3.1 x10E3/uL   Monocytes Absolute 0.6 0.1 - 0.9 x10E3/uL   EOS (ABSOLUTE) 0.2 0.0 - 0.4 x10E3/uL   Basophils Absolute 0.0 0.0 - 0.2 x10E3/uL   Immature Granulocytes 0 Not Estab. %   Immature Grans (Abs) 0.0 0.0 - 0.1 x10E3/uL  Comprehensive metabolic panel  Result Value Ref Range   Glucose 90 65 - 99 mg/dL   BUN 8 8 - 27 mg/dL   Creatinine, Ser 0.90 0.57 - 1.00 mg/dL   GFR calc non Af Amer 69 >59 mL/min/1.73   GFR calc Af Amer 79 >59 mL/min/1.73   BUN/Creatinine Ratio 9 (L) 12 - 28   Sodium 140 134 - 144 mmol/L   Potassium 4.4 3.5 - 5.2 mmol/L   Chloride 101 96 - 106  mmol/L   CO2 26 20 - 29 mmol/L   Calcium 10.1 8.7 - 10.3 mg/dL   Total Protein 7.3 6.0 - 8.5 g/dL   Albumin 4.3 3.8 - 4.8 g/dL   Globulin, Total 3.0 1.5 - 4.5 g/dL   Albumin/Globulin Ratio 1.4 1.2 - 2.2   Bilirubin Total 0.5 0.0 - 1.2 mg/dL   Alkaline Phosphatase 74 39 - 117 IU/L   AST 23 0 - 40 IU/L   ALT 18 0 - 32 IU/L  Lipid Panel w/o Chol/HDL Ratio  Result Value Ref Range   Cholesterol, Total 201 (H) 100 - 199 mg/dL   Triglycerides 122 0 - 149 mg/dL   HDL 60 >39 mg/dL   VLDL Cholesterol Cal 24 5 - 40 mg/dL   LDL Calculated 117 (H) 0 - 99 mg/dL  Microalbumin, Urine Waived  Result Value Ref Range   Microalb, Ur Waived 10 0 - 19 mg/L   Creatinine, Urine Waived 300 10 - 300 mg/dL   Microalb/Creat Ratio <30 <30 mg/g  TSH  Result Value Ref Range   TSH 2.170 0.450 - 4.500 uIU/mL  UA/M w/rflx Culture, Routine   Specimen: Urine   URINE  Result Value Ref Range   Specific Gravity, UA 1.025 1.005 - 1.030   pH, UA 5.0 5.0 - 7.5   Color, UA Yellow Yellow   Appearance Ur Clear Clear   Leukocytes, UA Negative Negative   Protein, UA Negative Negative/Trace   Glucose, UA Negative Negative   Ketones, UA Negative Negative   RBC, UA Negative Negative   Bilirubin, UA Negative Negative   Urobilinogen, Ur 0.2 0.2 - 1.0 mg/dL   Nitrite, UA Negative Negative  Cytology - PAP  Result Value Ref Range   Adequacy      Satisfactory for evaluation  endocervical/transformation zone component PRESENT.   Diagnosis      NEGATIVE FOR INTRAEPITHELIAL LESIONS OR MALIGNANCY.   HPV NOT DETECTED    Material Submitted CervicoVaginal Pap [ThinPrep Imaged]       Assessment & Plan:   Problem List Items Addressed This Visit    None       Follow up plan: No follow-ups on file.    . This visit was completed via [employer] due to the restrictions of the COVID-19 pandemic. All issues  as above were discussed and addressed. Physical exam was done as above through visual confirmation on D[employer]imity. If it  was felt that the patient should be evaluated in the office, they were directed there. The patient verbally consented to this visit. . Location of the patient: home . Location of the provider: work . Those involved with this call:  . Provider: Park Liter, DO . CMA: Tiffany Reel, CMA . Front Desk/Registration: Don Perking  . Time spent on call: 15 minutes with patient face to face via video conference. More than 50% of this time was spent in counseling and coordination of care. 23 minutes total spent in review of patient's record and preparation of their chart.

## 2019-07-26 NOTE — Assessment & Plan Note (Signed)
Under good control on current regimen. Continue current regimen. Continue to monitor. Call with any concerns. Refills given. Labs ordered- to be checked.

## 2019-09-04 ENCOUNTER — Telehealth: Payer: Self-pay | Admitting: Family Medicine

## 2019-09-04 MED ORDER — ETODOLAC 500 MG PO TABS: 500.0000 mg | ORAL_TABLET | Freq: Two times a day (BID) | ORAL | 3 refills | Status: DC | PRN

## 2019-09-04 NOTE — Telephone Encounter (Signed)
I don't have that on her medication list or that we've ever written it- did we write it or did she get it from someone else?

## 2019-09-04 NOTE — Telephone Encounter (Signed)
Medication Refill - Medication: etodolac (LODINE) 500 MG tablet  Pt still take this as needed  Has the patient contacted their pharmacy? Yes.   (Agent: If no, request that the patient contact the pharmacy for the refill.) (Agent: If yes, when and what did the pharmacy advise?)  Preferred Pharmacy (with phone number or street name):  [facility] [place] [phone] (Phone) [phone] (Fax)     Agent: Please be advised that RX refills may take up to 3 business days. We ask that you follow-up with your pharmacy.

## 2019-09-04 NOTE — Telephone Encounter (Signed)
Dr.Jack Eliberto Ivory- previous doctor, she uses it for bunions as needed when she has flare ups. She states that she has been on this for years just as needed.

## 2019-09-04 NOTE — Telephone Encounter (Signed)
Routing to provider  

## 2020-02-01 ENCOUNTER — Ambulatory Visit: Payer: Self-pay | Attending: Internal Medicine

## 2020-02-01 DIAGNOSIS — Z23 Encounter for immunization: Secondary | ICD-10-CM | POA: Insufficient documentation

## 2020-02-01 NOTE — Progress Notes (Signed)
   Covid-19 Vaccination Clinic  Name:  Jennifer Sanders    MRN: [id number] DOB: Jan 07, 1956  02/01/2020  Jennifer Sanders was observed post Covid-19 immunization for 15 minutes without incident. She was provided with Vaccine Information Sheet and instruction to access the V-Safe system.   Jennifer Sanders was instructed to call 911 with any severe reactions post vaccine: Marland Kitchen Difficulty breathing  . Swelling of face and throat  . A fast heartbeat  . A bad rash all over body  . Dizziness and weakness   Immunizations Administered    Name Date Dose VIS Date Route   Moderna COVID-19 Vaccine 02/01/2020  1:48 PM 0.5 mL 10/29/2019 Intramuscular   Manufacturer: Moderna   Lot: [id number]   [id number]

## 2020-02-29 ENCOUNTER — Ambulatory Visit: Payer: Self-pay | Attending: Internal Medicine

## 2020-02-29 DIAGNOSIS — Z23 Encounter for immunization: Secondary | ICD-10-CM

## 2020-02-29 NOTE — Progress Notes (Signed)
   Covid-19 Vaccination Clinic  Name:  Jennifer Sanders    MRN: [id number] DOB: [birth year]  02/29/2020  [name] was observed post Covid-19 immunization for 15 minutes without incident. She was provided with Vaccine Information Sheet and instruction to access the V-Safe system.   [name] was instructed to call 911 with any severe reactions post vaccine: Marland Kitchen Difficulty breathing  . Swelling of face and throat  . A fast heartbeat  . A bad rash all over body  . Dizziness and weakness   Immunizations Administered    Name Date Dose VIS Date Route   Moderna COVID-19 Vaccine 02/29/2020  1:23 PM 0.5 mL 10/29/2019 Intramuscular   Manufacturer: Levan Hurst   [id number]   [id number]

## 2020-04-29 ENCOUNTER — Other Ambulatory Visit: Payer: Self-pay | Admitting: Family Medicine

## 2020-05-06 ENCOUNTER — Other Ambulatory Visit: Payer: Self-pay | Admitting: Family Medicine

## 2020-09-16 ENCOUNTER — Other Ambulatory Visit: Payer: Self-pay

## 2020-09-16 ENCOUNTER — Encounter: Payer: Self-pay | Admitting: Family Medicine

## 2020-09-16 ENCOUNTER — Telehealth (INDEPENDENT_AMBULATORY_CARE_PROVIDER_SITE_OTHER): Payer: 59 | Admitting: Family Medicine

## 2020-09-16 VITALS — BP 129/85 | HR 90 | Temp 98.9°F | Ht 61.0 in | Wt 194.0 lb

## 2020-09-16 DIAGNOSIS — I10 Essential (primary) hypertension: Secondary | ICD-10-CM

## 2020-09-16 DIAGNOSIS — Z1231 Encounter for screening mammogram for malignant neoplasm of breast: Secondary | ICD-10-CM

## 2020-09-16 MED ORDER — ETODOLAC 500 MG PO TABS
500.0000 mg | ORAL_TABLET | Freq: Two times a day (BID) | ORAL | 3 refills | Status: DC | PRN
Start: 2020-09-16 — End: 2021-03-18

## 2020-09-16 MED ORDER — LISINOPRIL-HYDROCHLOROTHIAZIDE 20-12.5 MG PO TABS
1.0000 | ORAL_TABLET | Freq: Every day | ORAL | 1 refills | Status: DC
Start: 2020-09-16 — End: 2021-03-18

## 2020-09-16 MED ORDER — TOBRADEX 0.3-0.1 % OP OINT: 1.0000 "application " | TOPICAL_OINTMENT | Freq: Three times a day (TID) | OPHTHALMIC | 0 refills | Status: DC

## 2020-09-16 NOTE — Assessment & Plan Note (Signed)
Under good control on current regimen. Continue current regimen. Continue to monitor. Call with any concerns. Refills given. Labs drawn today.   

## 2020-09-16 NOTE — Progress Notes (Signed)
BP 129/85 (BP Location: Left Arm, Patient Position: Sitting)   Pulse 90   Temp 98.9 F (37.2 C) (Oral)   Ht 5\' 1"  (1.549 m)   Wt 194 lb (88 kg)   SpO2 98%   BMI 36.66 kg/m    Subjective:    Patient ID: Jennifer Sanders, female    DOB: 04-Jun-1956, 64 y.o.   MRN: [id number]  HPI: Jennifer Sanders is a 64 y.o. female  Chief Complaint  Patient presents with  . Medication Refill  . Hypertension   HYPERTENSION Hypertension status: controlled  Satisfied with current treatment? yes Duration of hypertension: chronic BP monitoring frequency:  not checking BP medication side effects:  no Medication compliance: excellent compliance Previous BP meds: lisinopril-HCTZ Aspirin: no Recurrent headaches: no Visual changes: no Palpitations: no Dyspnea: no Chest pain: no Lower extremity edema: no Dizzy/lightheaded: no  Relevant past medical, surgical, family and social history reviewed and updated as indicated. Interim medical history since our last visit reviewed. Allergies and medications reviewed and updated.  Review of Systems  Constitutional: Negative.   Respiratory: Negative.   Cardiovascular: Negative.   Gastrointestinal: Negative.   Neurological: Negative.   Psychiatric/Behavioral: Negative.     Per HPI unless specifically indicated above     Objective:    BP 129/85 (BP Location: Left Arm, Patient Position: Sitting)   Pulse 90   Temp 98.9 F (37.2 C) (Oral)   Ht 5\' 1"  (1.549 m)   Wt 194 lb (88 kg)   SpO2 98%   BMI 36.66 kg/m   Wt Readings from Last 3 Encounters:  09/16/20 194 lb (88 kg)  07/26/19 181 lb (82.1 kg)  01/25/19 193 lb (87.5 kg)    Physical Exam Vitals and nursing note reviewed.  Constitutional:      General: She is not in acute distress.    Appearance: Normal appearance. She is not ill-appearing, toxic-appearing or diaphoretic.  HENT:     Head: Normocephalic and atraumatic.     Right Ear: External ear normal.     Left Ear: External ear  normal.     Nose: Nose normal.     Mouth/Throat:     Mouth: Mucous membranes are moist.     Pharynx: Oropharynx is clear.  Eyes:     General: No scleral icterus.       Right eye: No discharge.        Left eye: No discharge.     Extraocular Movements: Extraocular movements intact.     Conjunctiva/sclera: Conjunctivae normal.     Pupils: Pupils are equal, round, and reactive to light.  Cardiovascular:     Rate and Rhythm: Normal rate and regular rhythm.     Pulses: Normal pulses.     Heart sounds: Normal heart sounds. No murmur heard.  No friction rub. No gallop.   Pulmonary:     Effort: Pulmonary effort is normal. No respiratory distress.     Breath sounds: Normal breath sounds. No stridor. No wheezing, rhonchi or rales.  Chest:     Chest wall: No tenderness.  Musculoskeletal:        General: Normal range of motion.     Cervical back: Normal range of motion and neck supple.  Skin:    General: Skin is warm and dry.     Capillary Refill: Capillary refill takes less than 2 seconds.     Coloration: Skin is not jaundiced or pale.     Findings: No bruising, erythema, lesion or  rash.  Neurological:     General: No focal deficit present.     Mental Status: She is alert and oriented to person, place, and time. Mental status is at baseline.  Psychiatric:        Mood and Affect: Mood normal.        Behavior: Behavior normal.        Thought Content: Thought content normal.        Judgment: Judgment normal.     Results for orders placed or performed in visit on 01/25/19  CBC with Differential/Platelet  Result Value Ref Range   WBC 7.8 3.4 - 10.8 x10E3/uL   RBC 5.19 3.77 - 5.28 x10E6/uL   Hemoglobin 14.3 11.1 - 15.9 g/dL   Hematocrit 43.1 34.0 - 46.6 %   MCV 83 79 - 97 fL   MCH 27.6 26.6 - 33.0 pg   MCHC 33.2 31 - 35 g/dL   RDW 13.4 11.7 - 15.4 %   Platelets 254 150 - 450 x10E3/uL   Neutrophils 67 Not Estab. %   Lymphs 23 Not Estab. %   Monocytes 7 Not Estab. %   Eos 2 Not  Estab. %   Basos 1 Not Estab. %   Neutrophils Absolute 5.2 1.40 - 7.00 x10E3/uL   Lymphocytes Absolute 1.8 0 - 3 x10E3/uL   Monocytes Absolute 0.6 0 - 0 x10E3/uL   EOS (ABSOLUTE) 0.2 0.0 - 0.4 x10E3/uL   Basophils Absolute 0.0 0 - 0 x10E3/uL   Immature Granulocytes 0 Not Estab. %   Immature Grans (Abs) 0.0 0.0 - 0.1 x10E3/uL  Comprehensive metabolic panel  Result Value Ref Range   Glucose 90 65 - 99 mg/dL   BUN 8 8 - 27 mg/dL   Creatinine, Ser 0.90 0.57 - 1.00 mg/dL   GFR calc non Af Amer 69 >59 mL/min/1.73   GFR calc Af Amer 79 >59 mL/min/1.73   BUN/Creatinine Ratio 9 (L) 12 - 28   Sodium 140 134 - 144 mmol/L   Potassium 4.4 3.5 - 5.2 mmol/L   Chloride 101 96 - 106 mmol/L   CO2 26 20 - 29 mmol/L   Calcium 10.1 8.7 - 10.3 mg/dL   Total Protein 7.3 6.0 - 8.5 g/dL   Albumin 4.3 3.8 - 4.8 g/dL   Globulin, Total 3.0 1.5 - 4.5 g/dL   Albumin/Globulin Ratio 1.4 1.2 - 2.2   Bilirubin Total 0.5 0.0 - 1.2 mg/dL   Alkaline Phosphatase 74 39 - 117 IU/L   AST 23 0 - 40 IU/L   ALT 18 0 - 32 IU/L  Lipid Panel w/o Chol/HDL Ratio  Result Value Ref Range   Cholesterol, Total 201 (H) 100 - 199 mg/dL   Triglycerides 122 0 - 149 mg/dL   HDL 60 >39 mg/dL   VLDL Cholesterol Cal 24 5 - 40 mg/dL   LDL Calculated 117 (H) 0 - 99 mg/dL  Microalbumin, Urine Waived  Result Value Ref Range   Microalb, Ur Waived 10 0 - 19 mg/L   Creatinine, Urine Waived 300 10 - 300 mg/dL   Microalb/Creat Ratio <30 <30 mg/g  TSH  Result Value Ref Range   TSH 2.170 0.450 - 4.500 uIU/mL  UA/M w/rflx Culture, Routine   Specimen: Urine   URINE  Result Value Ref Range   Specific Gravity, UA 1.025 1.005 - 1.030   pH, UA 5.0 5.0 - 7.5   Color, UA Yellow Yellow   Appearance Ur Clear Clear   Leukocytes, UA Negative  Negative   Protein, UA Negative Negative/Trace   Glucose, UA Negative Negative   Ketones, UA Negative Negative   RBC, UA Negative Negative   Bilirubin, UA Negative Negative   Urobilinogen, Ur 0.2 0.2 -  1.0 mg/dL   Nitrite, UA Negative Negative  Cytology - PAP  Result Value Ref Range   Adequacy      Satisfactory for evaluation  endocervical/transformation zone component PRESENT.   Diagnosis      NEGATIVE FOR INTRAEPITHELIAL LESIONS OR MALIGNANCY.   HPV NOT DETECTED    Material Submitted CervicoVaginal Pap [ThinPrep Imaged]       Assessment & Plan:   Problem List Items Addressed This Visit      Cardiovascular and Mediastinum   Hypertension - Primary    Under good control on current regimen. Continue current regimen. Continue to monitor. Call with any concerns. Refills given. Labs drawn today.       Relevant Medications   lisinopril-hydrochlorothiazide (ZESTORETIC) 20-12.5 MG tablet   Other Relevant Orders   Basic metabolic panel    Other Visit Diagnoses    Encounter for screening mammogram for malignant neoplasm of breast       Mammogram ordered today.   Relevant Orders   MM 3D SCREEN BREAST BILATERAL       Follow up plan: Return in about 6 months (around 03/17/2021) for physical.

## 2020-09-17 ENCOUNTER — Encounter: Payer: Self-pay | Admitting: Family Medicine

## 2020-09-17 LAB — BASIC METABOLIC PANEL
BUN/Creatinine Ratio: 8 — ABNORMAL LOW (ref 12–28)
BUN: 7 mg/dL — ABNORMAL LOW (ref 8–27)
CO2: 26 mmol/L (ref 20–29)
Calcium: 9.9 mg/dL (ref 8.7–10.3)
Chloride: 102 mmol/L (ref 96–106)
Creatinine, Ser: 0.92 mg/dL (ref 0.57–1.00)
GFR calc Af Amer: 77 mL/min/{1.73_m2} (ref >59)
GFR calc non Af Amer: 66 mL/min/{1.73_m2} (ref >59)
Glucose: 95 mg/dL (ref 65–99)
Potassium: 4.4 mmol/L (ref 3.5–5.2)
Sodium: 142 mmol/L (ref 134–144)

## 2020-11-30 ENCOUNTER — Ambulatory Visit: Payer: 59 | Admitting: Nurse Practitioner

## 2020-12-07 ENCOUNTER — Ambulatory Visit
Admission: RE | Admit: 2020-12-07 | Discharge: 2020-12-07 | Disposition: A | Discharge status: Home / Self Care | Payer: 59 | Source: Ambulatory Visit | Attending: Family Medicine | Admitting: Family Medicine

## 2020-12-07 ENCOUNTER — Other Ambulatory Visit: Payer: Self-pay | Admitting: Family Medicine

## 2020-12-07 ENCOUNTER — Other Ambulatory Visit: Payer: Self-pay

## 2020-12-07 DIAGNOSIS — Z1231 Encounter for screening mammogram for malignant neoplasm of breast: Secondary | ICD-10-CM | POA: Insufficient documentation

## 2020-12-07 DIAGNOSIS — N6320 Unspecified lump in the left breast, unspecified quadrant: Secondary | ICD-10-CM

## 2020-12-07 DIAGNOSIS — R928 Other abnormal and inconclusive findings on diagnostic imaging of breast: Secondary | ICD-10-CM

## 2020-12-08 ENCOUNTER — Inpatient Hospital Stay
Admission: RE | Admit: 2020-12-08 | Discharge: 2020-12-08 | Disposition: A | Discharge status: Home / Self Care | Payer: Self-pay | Source: Ambulatory Visit | Attending: *Deleted | Admitting: *Deleted

## 2020-12-08 ENCOUNTER — Other Ambulatory Visit: Payer: Self-pay | Admitting: *Deleted

## 2020-12-08 DIAGNOSIS — Z1231 Encounter for screening mammogram for malignant neoplasm of breast: Secondary | ICD-10-CM

## 2020-12-14 ENCOUNTER — Telehealth: Payer: Self-pay | Admitting: Family Medicine

## 2020-12-14 NOTE — Telephone Encounter (Signed)
Called and discussed results with patient. See result note

## 2020-12-14 NOTE — Telephone Encounter (Signed)
Patient is calling to receive her mammogram results. Patient received a call today stating that there was a mass and further imaging needed to be done. Patient is not sure who called her. Please advise Cb- [phone]

## 2020-12-16 ENCOUNTER — Ambulatory Visit
Admission: RE | Admit: 2020-12-16 | Discharge: 2020-12-16 | Disposition: A | Discharge status: Home / Self Care | Payer: 59 | Source: Ambulatory Visit | Attending: Family Medicine | Admitting: Family Medicine

## 2020-12-16 ENCOUNTER — Other Ambulatory Visit: Payer: Self-pay

## 2020-12-16 ENCOUNTER — Other Ambulatory Visit: Payer: Self-pay | Admitting: Family Medicine

## 2020-12-16 DIAGNOSIS — R928 Other abnormal and inconclusive findings on diagnostic imaging of breast: Secondary | ICD-10-CM | POA: Diagnosis not present

## 2020-12-16 DIAGNOSIS — N6320 Unspecified lump in the left breast, unspecified quadrant: Secondary | ICD-10-CM

## 2020-12-18 ENCOUNTER — Other Ambulatory Visit: Payer: Self-pay

## 2020-12-18 ENCOUNTER — Ambulatory Visit
Admission: RE | Admit: 2020-12-18 | Discharge: 2020-12-18 | Disposition: A | Discharge status: Home / Self Care | Payer: 59 | Source: Ambulatory Visit | Attending: Family Medicine | Admitting: Family Medicine

## 2020-12-18 DIAGNOSIS — R928 Other abnormal and inconclusive findings on diagnostic imaging of breast: Secondary | ICD-10-CM | POA: Diagnosis not present

## 2020-12-18 DIAGNOSIS — N6320 Unspecified lump in the left breast, unspecified quadrant: Secondary | ICD-10-CM

## 2020-12-18 HISTORY — PX: BREAST BIOPSY: SHX20

## 2020-12-21 ENCOUNTER — Encounter: Payer: Self-pay | Admitting: *Deleted

## 2020-12-21 DIAGNOSIS — C50912 Malignant neoplasm of unspecified site of left female breast: Secondary | ICD-10-CM

## 2020-12-21 NOTE — Progress Notes (Unsigned)
Received notification from Electa Sniff, RN that patient had been notified of her new diagnosis of left breast cancer and was ready for navigation.  Called patient.  She was well informed of her diagnosis.  She would like to see Dr. Grayland Ormond for medical oncology and Dr. Christian Mate for a surgical consult.  I have scheduled her to see Dr. Christian Mate tomorrow at 11:00 and Dr. Grayland Ormond on Thursday 12/24/20 @ 1:30.  She will pick up her educational material at her appointment tomorrow.

## 2020-12-22 ENCOUNTER — Ambulatory Visit (INDEPENDENT_AMBULATORY_CARE_PROVIDER_SITE_OTHER): Payer: 59 | Admitting: Surgery

## 2020-12-22 ENCOUNTER — Ambulatory Visit: Payer: Self-pay | Admitting: Surgery

## 2020-12-22 ENCOUNTER — Telehealth: Payer: Self-pay | Admitting: Surgery

## 2020-12-22 ENCOUNTER — Other Ambulatory Visit: Payer: Self-pay | Admitting: Surgery

## 2020-12-22 ENCOUNTER — Other Ambulatory Visit: Payer: Self-pay

## 2020-12-22 ENCOUNTER — Encounter: Payer: Self-pay | Admitting: Surgery

## 2020-12-22 VITALS — BP 142/80 | HR 77 | Temp 98.3°F | Ht 60.0 in | Wt 195.0 lb

## 2020-12-22 DIAGNOSIS — C50412 Malignant neoplasm of upper-outer quadrant of left female breast: Secondary | ICD-10-CM

## 2020-12-22 DIAGNOSIS — C50919 Malignant neoplasm of unspecified site of unspecified female breast: Secondary | ICD-10-CM

## 2020-12-22 MED ORDER — LIDOCAINE-PRILOCAINE 2.5-2.5 % EX CREA: TOPICAL_CREAM | CUTANEOUS | 0 refills | Status: DC

## 2020-12-22 NOTE — Progress Notes (Signed)
Patient ID: Jennifer Sanders, female   DOB: [birth year], 65 y.o.   MRN: [id number]  Chief Complaint: Invasive mammary carcinoma left breast  History of Present Illness Jennifer Sanders is a 65 y.o. female with recent diagnosis of invasive mammary carcinoma left breast, prognostic indicators pending.  No prior history of palpable mass or skin changes.  Screening mammography found lesion, confirmed and biopsied under ultrasound guidance.  Left axillary evaluation was negative for lymphadenopathy.  This is her first breast biopsy.  She has history of utilizing birth control.  She underwent menopause at the age of 17.  She has no family history of breast cancer.  She is pregnant at the age of 51 under going menses at the age of 28.  She had total of 3 pregnancies.  She reports she did breast-feed.  No prior breast symptoms whatsoever.  Past Medical History Past Medical History:  Diagnosis Date  . Bilateral carpal tunnel syndrome   . Diverticulosis    found on colonoscopy on 05/24/16  . Hypertension   . Meralgia paraesthetica, left       Past Surgical History:  Procedure Laterality Date  . CHOLECYSTECTOMY  1990s  . COLONOSCOPY  05/24/2016   internal hemorrhoids, diverticulosis    No Known Allergies  Current Outpatient Medications  Medication Sig Dispense Refill  . desonide (DESOWEN) 0.05 % lotion Apply topically.     Marland Kitchen etodolac (LODINE) 500 MG tablet Take 1 tablet (500 mg total) by mouth 2 (two) times daily as needed. 60 tablet 3  . lidocaine-prilocaine (EMLA) cream Apply to the areola of the surgical breast and cover with plastic wrap one hour prior to leaving for surgery. 5 g 0  . lisinopril-hydrochlorothiazide (ZESTORETIC) 20-12.5 MG tablet Take 1 tablet by mouth daily. 90 tablet 1  . pyridoxine (B-6) 100 MG tablet Take by mouth.    . tobramycin-dexamethasone (TOBRADEX) ophthalmic ointment Place 1 application into both eyes 3 (three) times daily. Use for flares of skin irritation around  the eyes (Patient taking differently: Place 1 application into both eyes as needed. Use for flares of skin irritation around the eyes) 3.5 g 0   No current facility-administered medications for this visit.    Family History Family History  Problem Relation Age of Onset  . Hyperlipidemia Mother   . Cancer Mother        lung  . Cancer Father        esophageal  . Throat cancer Maternal Grandmother   . Breast cancer Neg Hx       Social History Social History   Tobacco Use  . Smoking status: Never Smoker  . Smokeless tobacco: Never Used  Vaping Use  . Vaping Use: Never used  Substance Use Topics  . Alcohol use: No  . Drug use: No        Review of Systems  Constitutional: Negative.   HENT: Negative.   Eyes: Negative.   Respiratory: Negative.   Cardiovascular: Negative.   Gastrointestinal: Negative.   Genitourinary: Negative.   Skin: Negative.   Neurological: Negative.   Psychiatric/Behavioral: Negative.       Physical Exam Blood pressure (!) 142/80, pulse 77, temperature 98.3 F (36.8 C), height 5' (1.524 m), weight 195 lb (88.5 kg), SpO2 98 %. Last Weight  Most recent update: 12/22/2020 11:09 AM   Weight  88.5 kg (195 lb)            CONSTITUTIONAL: Well developed, and nourished, appropriately responsive and aware without distress.  EYES: Sclera non-icteric.   EARS, NOSE, MOUTH AND THROAT: Mask worn.   Hearing is intact to voice.  NECK: Trachea is midline, and there is no jugular venous distension.  LYMPH NODES:  Lymph nodes in the neck are not enlarged. RESPIRATORY:  Lungs are clear, and breath sounds are equal bilaterally. Normal respiratory effort without pathologic use of accessory muscles. CARDIOVASCULAR: Heart is regular in rate and rhythm. GI: The abdomen is soft, nontender, and nondistended. There were no palpable masses. I did not appreciate hepatosplenomegaly. There were normal bowel sounds. GU: There is a Band-Aid with Steri-Strips in the upper  outer quadrant of the left breast.  No appreciable ecchymosis or hematoma present.  Other than this there is no dominant, nor suspicious nodularity or masses in either breast.  No evidence of any skin changes. MUSCULOSKELETAL:  Symmetrical muscle tone appreciated in all four extremities.    SKIN: Skin turgor is normal. No pathologic skin lesions appreciated.  NEUROLOGIC:  Motor and sensation appear grossly normal.  Cranial nerves are grossly without defect. PSYCH:  Alert and oriented to person, place and time. Affect is appropriate for situation.  Data Reviewed I have personally reviewed what is currently available of the patient's imaging, recent labs and medical records.   Labs:  Surgical Pathology Report      Specimen Submitted:  A. Breast, left, 12:30; biopsy   Clinical History: Left breast mass, 1 cm, located at 12:30 o'clock axis,  8 cm from the nipple, highly suspicious for malignancy.  Vision-shaped clip was deployed.      DIAGNOSIS:  A. BREAST, LEFT, 12:30 8 CM FROM NIPPLE; ULTRASOUND-GUIDED CORE BIOPSY:  - INVASIVE MAMMARY CARCINOMA, NO SPECIAL TYPE.   Size of invasive carcinoma: 8 mm in this sample  Histologic grade of invasive carcinoma: Grade 1            Glandular/tubular differentiation score: 1            Nuclear pleomorphism score: 2            Mitotic rate score: 1            Total score: 4  Ductal carcinoma in situ: Not identified  Lymphovascular invasion: Not identified   ER/PR/HER2: Immunohistochemistry will be performed on block A2, with  reflex to [place] for HER2 2+. The results will be reported in an addendum.    Imaging: Radiology review:   CLINICAL DATA:  Patient returns today to evaluate a possible LEFT breast mass identified on recent screening mammogram.  EXAM: DIGITAL DIAGNOSTIC LEFT MAMMOGRAM WITH CAD AND TOMOSYNTHESIS  ULTRASOUND BREAST LEFT  TECHNIQUE: Left digital diagnostic  mammography, ultrasound and breast tomosynthesis was performed. Digital images of the breast were evaluated with computer-aided detection. Targeted ultrasound examination of the breast was performed.  COMPARISON:  Previous exams including recent screening mammogram dated 12/07/2020.  ACR Breast Density Category b: There are scattered areas of fibroglandular density.  FINDINGS: On today's additional diagnostic views, including spot compression with 3D tomosynthesis, a spiculated mass is confirmed within the upper-outer quadrant of the LEFT breast, at middle depth, measuring approximately 1 cm greatest dimension.  Targeted ultrasound is performed, showing an irregular hypoechoic mass in the LEFT breast at the 12:30 o'clock axis, 8 cm from the nipple, with partially indistinct margins, measuring 1 x 0.8 x 0.9 cm, corresponding to the mammographic finding.  LEFT axilla was evaluated with ultrasound showing no enlarged or morphologically abnormal lymph nodes.  IMPRESSION: Highly suspicious mass within the LEFT breast  at the 12:30 o'clock axis, 8 cm from the nipple, measuring 1 cm, corresponding to the mammographic finding. Ultrasound-guided biopsy is recommended.  RECOMMENDATION: Ultrasound-guided biopsy of the LEFT breast mass at the 12:30 o'clock axis, 8 cm from the nipple, measuring 1 cm.  Ordering physician will be contacted and patient will be scheduled for ultrasound-guided biopsy at her convenience.  I have discussed the findings and recommendations with the patient. If applicable, a reminder letter will be sent to the patient regarding the next appointment.  BI-RADS CATEGORY  5: Highly suggestive of malignancy.   Electronically Signed   By: Franki Cabot M.D.   On: 12/16/2020 13:56  Within last 24 hrs: No results found.  Assessment    Invasive mammary carcinoma left breast, prognostic indicators pending. Patient Active Problem List   Diagnosis Date  Noted  . Hypertension   . Bilateral carpal tunnel syndrome   . Meralgia paraesthetica, left   . Diverticulosis     Plan    I discussed the available options with the patient, who came alone.  The risk of recurrence is similar between mastectomy and lumpectomy with radiation.  I also discussed that given the small size of the cancer would recommend a localization lumpectomy with radiation to follow.  I also discussed that we would need to do a sentinel lymph node biopsy to check the nodes.    Alternatives discussed including mastectomy with immediate reconstruction. I discussed risks of bleeding, infection, damage to surrounding tissues, having positive margins, needing additional resection, trauma to nerves causing arm numbness or difficulty raising arm, causing lymphoedema in the arm; as well as anesthesia risks of MI, stroke, prolonged ventilation, pulmonary embolism, thrombosis and even death.   Patient was given the opportunity to ask questions and have them answered.  They would like to proceed with left breast RF ID localized lumpectomy with sentinel lymph node biopsy.   Face-to-face time spent with the patient and accompanying care providers(if present) was 30 minutes, with more than 50% of the time spent counseling, educating, and coordinating care of the patient.      Ronny Bacon M.D., FACS 12/22/2020, 12:42 PM

## 2020-12-22 NOTE — H&P (View-Only) (Signed)
Patient ID: Jennifer Sanders, female   DOB: [birth year], 65 y.o.   MRN: [id number]  Chief Complaint: Invasive mammary carcinoma left breast  History of Present Illness Jennifer Sanders is a 65 y.o. female with recent diagnosis of invasive mammary carcinoma left breast, prognostic indicators pending.  No prior history of palpable mass or skin changes.  Screening mammography found lesion, confirmed and biopsied under ultrasound guidance.  Left axillary evaluation was negative for lymphadenopathy.  This is her first breast biopsy.  She has history of utilizing birth control.  She underwent menopause at the age of 17.  She has no family history of breast cancer.  She is pregnant at the age of 51 under going menses at the age of 28.  She had total of 3 pregnancies.  She reports she did breast-feed.  No prior breast symptoms whatsoever.  Past Medical History Past Medical History:  Diagnosis Date  . Bilateral carpal tunnel syndrome   . Diverticulosis    found on colonoscopy on 05/24/16  . Hypertension   . Meralgia paraesthetica, left       Past Surgical History:  Procedure Laterality Date  . CHOLECYSTECTOMY  1990s  . COLONOSCOPY  05/24/2016   internal hemorrhoids, diverticulosis    No Known Allergies  Current Outpatient Medications  Medication Sig Dispense Refill  . desonide (DESOWEN) 0.05 % lotion Apply topically.     Marland Kitchen etodolac (LODINE) 500 MG tablet Take 1 tablet (500 mg total) by mouth 2 (two) times daily as needed. 60 tablet 3  . lidocaine-prilocaine (EMLA) cream Apply to the areola of the surgical breast and cover with plastic wrap one hour prior to leaving for surgery. 5 g 0  . lisinopril-hydrochlorothiazide (ZESTORETIC) 20-12.5 MG tablet Take 1 tablet by mouth daily. 90 tablet 1  . pyridoxine (B-6) 100 MG tablet Take by mouth.    . tobramycin-dexamethasone (TOBRADEX) ophthalmic ointment Place 1 application into both eyes 3 (three) times daily. Use for flares of skin irritation around  the eyes (Patient taking differently: Place 1 application into both eyes as needed. Use for flares of skin irritation around the eyes) 3.5 g 0   No current facility-administered medications for this visit.    Family History Family History  Problem Relation Age of Onset  . Hyperlipidemia Mother   . Cancer Mother        lung  . Cancer Father        esophageal  . Throat cancer Maternal Grandmother   . Breast cancer Neg Hx       Social History Social History   Tobacco Use  . Smoking status: Never Smoker  . Smokeless tobacco: Never Used  Vaping Use  . Vaping Use: Never used  Substance Use Topics  . Alcohol use: No  . Drug use: No        Review of Systems  Constitutional: Negative.   HENT: Negative.   Eyes: Negative.   Respiratory: Negative.   Cardiovascular: Negative.   Gastrointestinal: Negative.   Genitourinary: Negative.   Skin: Negative.   Neurological: Negative.   Psychiatric/Behavioral: Negative.       Physical Exam Blood pressure (!) 142/80, pulse 77, temperature 98.3 F (36.8 C), height 5' (1.524 m), weight 195 lb (88.5 kg), SpO2 98 %. Last Weight  Most recent update: 12/22/2020 11:09 AM   Weight  88.5 kg (195 lb)            CONSTITUTIONAL: Well developed, and nourished, appropriately responsive and aware without distress.  EYES: Sclera non-icteric.   EARS, NOSE, MOUTH AND THROAT: Mask worn.   Hearing is intact to voice.  NECK: Trachea is midline, and there is no jugular venous distension.  LYMPH NODES:  Lymph nodes in the neck are not enlarged. RESPIRATORY:  Lungs are clear, and breath sounds are equal bilaterally. Normal respiratory effort without pathologic use of accessory muscles. CARDIOVASCULAR: Heart is regular in rate and rhythm. GI: The abdomen is soft, nontender, and nondistended. There were no palpable masses. I did not appreciate hepatosplenomegaly. There were normal bowel sounds. GU: There is a Band-Aid with Steri-Strips in the upper  outer quadrant of the left breast.  No appreciable ecchymosis or hematoma present.  Other than this there is no dominant, nor suspicious nodularity or masses in either breast.  No evidence of any skin changes. MUSCULOSKELETAL:  Symmetrical muscle tone appreciated in all four extremities.    SKIN: Skin turgor is normal. No pathologic skin lesions appreciated.  NEUROLOGIC:  Motor and sensation appear grossly normal.  Cranial nerves are grossly without defect. PSYCH:  Alert and oriented to person, place and time. Affect is appropriate for situation.  Data Reviewed I have personally reviewed what is currently available of the patient's imaging, recent labs and medical records.   Labs:  Surgical Pathology Report      Specimen Submitted:  A. Breast, left, 12:30; biopsy   Clinical History: Left breast mass, 1 cm, located at 12:30 o'clock axis,  8 cm from the nipple, highly suspicious for malignancy.  Vision-shaped clip was deployed.      DIAGNOSIS:  A. BREAST, LEFT, 12:30 8 CM FROM NIPPLE; ULTRASOUND-GUIDED CORE BIOPSY:  - INVASIVE MAMMARY CARCINOMA, NO SPECIAL TYPE.   Size of invasive carcinoma: 8 mm in this sample  Histologic grade of invasive carcinoma: Grade 1            Glandular/tubular differentiation score: 1            Nuclear pleomorphism score: 2            Mitotic rate score: 1            Total score: 4  Ductal carcinoma in situ: Not identified  Lymphovascular invasion: Not identified   ER/PR/HER2: Immunohistochemistry will be performed on block A2, with  reflex to FISH for HER2 2+. The results will be reported in an addendum.    Imaging: Radiology review:   CLINICAL DATA:  Patient returns today to evaluate a possible LEFT breast mass identified on recent screening mammogram.  EXAM: DIGITAL DIAGNOSTIC LEFT MAMMOGRAM WITH CAD AND TOMOSYNTHESIS  ULTRASOUND BREAST LEFT  TECHNIQUE: Left digital diagnostic  mammography, ultrasound and breast tomosynthesis was performed. Digital images of the breast were evaluated with computer-aided detection. Targeted ultrasound examination of the breast was performed.  COMPARISON:  Previous exams including recent screening mammogram dated 12/07/2020.  ACR Breast Density Category b: There are scattered areas of fibroglandular density.  FINDINGS: On today's additional diagnostic views, including spot compression with 3D tomosynthesis, a spiculated mass is confirmed within the upper-outer quadrant of the LEFT breast, at middle depth, measuring approximately 1 cm greatest dimension.  Targeted ultrasound is performed, showing an irregular hypoechoic mass in the LEFT breast at the 12:30 o'clock axis, 8 cm from the nipple, with partially indistinct margins, measuring 1 x 0.8 x 0.9 cm, corresponding to the mammographic finding.  LEFT axilla was evaluated with ultrasound showing no enlarged or morphologically abnormal lymph nodes.  IMPRESSION: Highly suspicious mass within the LEFT breast   at the 12:30 o'clock axis, 8 cm from the nipple, measuring 1 cm, corresponding to the mammographic finding. Ultrasound-guided biopsy is recommended.  RECOMMENDATION: Ultrasound-guided biopsy of the LEFT breast mass at the 12:30 o'clock axis, 8 cm from the nipple, measuring 1 cm.  Ordering physician will be contacted and patient will be scheduled for ultrasound-guided biopsy at her convenience.  I have discussed the findings and recommendations with the patient. If applicable, a reminder letter will be sent to the patient regarding the next appointment.  BI-RADS CATEGORY  5: Highly suggestive of malignancy.   Electronically Signed   By: Franki Cabot M.D.   On: 12/16/2020 13:56  Within last 24 hrs: No results found.  Assessment    Invasive mammary carcinoma left breast, prognostic indicators pending. Patient Active Problem List   Diagnosis Date  Noted  . Hypertension   . Bilateral carpal tunnel syndrome   . Meralgia paraesthetica, left   . Diverticulosis     Plan    I discussed the available options with the patient, who came alone.  The risk of recurrence is similar between mastectomy and lumpectomy with radiation.  I also discussed that given the small size of the cancer would recommend a localization lumpectomy with radiation to follow.  I also discussed that we would need to do a sentinel lymph node biopsy to check the nodes.    Alternatives discussed including mastectomy with immediate reconstruction. I discussed risks of bleeding, infection, damage to surrounding tissues, having positive margins, needing additional resection, trauma to nerves causing arm numbness or difficulty raising arm, causing lymphoedema in the arm; as well as anesthesia risks of MI, stroke, prolonged ventilation, pulmonary embolism, thrombosis and even death.   Patient was given the opportunity to ask questions and have them answered.  They would like to proceed with left breast RF ID localized lumpectomy with sentinel lymph node biopsy.   Face-to-face time spent with the patient and accompanying care providers(if present) was 30 minutes, with more than 50% of the time spent counseling, educating, and coordinating care of the patient.      Ronny Bacon M.D., FACS 12/22/2020, 12:42 PM

## 2020-12-22 NOTE — Telephone Encounter (Signed)
Patient has been advised of Pre-Admission date/time, COVID Testing date and Surgery date.  Surgery Date: 01/01/21 Preadmission Testing Date: 12/23/20 (phone 1p-5p) Covid Testing Date: 12/30/20 - patient advised to go to the [facility] ([place]) between 8a-1p  Patient scheduled for her RF tag to be placed at [facility] on 12/28/20.   Patient has been made aware to arrive 7:45 on 01/01/21, she will have SLN bx done first and transferred to same day surgery with Dr. Christian Mate.  Patient voices understanding.

## 2020-12-22 NOTE — Patient Instructions (Addendum)
Follow up with Oncology on 12/24/20 as scheduled.    We have spoken today about removing a lump in your breast. This will be done by Dr. Christian Sanders at [facility]. We will get you scheduled for a marker to be placed in your breast prior to your surgery.  You will most likely be able to leave the hospital several hours after your surgery. Rarely, a patient needs to stay over night but this is a possibility.  Please see your Blue surgery sheet. Our surgery scheduler will be in contact with you to look at surgery dates and go over information.   Plan to tenatively be off work for up to1 week following the surgery.     Lumpectomy A lumpectomy is a form of "breast conserving" or "breast preservation" surgery. It may also be referred to as a partial mastectomy. During a lumpectomy, the portion of the breast that contains the cancerous tumor or breast mass (the lump) is removed. Some normal tissue around the lump may also be removed to make sure all of the tumor has been removed.  LET [facility] CARE PROVIDER KNOW ABOUT:  Any allergies you have.  All medicines you are taking, including vitamins, herbs, eye drops, creams, and over-the-counter medicines.  Previous problems you or members of your family have had with the use of anesthetics.  Any blood disorders you have.  Previous surgeries you have had.  Medical conditions you have. RISKS AND COMPLICATIONS Generally, this is a safe procedure. However, problems can occur and include:  Bleeding.  Infection.  Pain.  Temporary swelling.  Change in the shape of the breast, particularly if a large portion is removed. BEFORE THE PROCEDURE  Ask your health care provider about changing or stopping your regular medicines. This is especially important if you are taking diabetes medicines or blood thinners.  Do not eat or drink anything after midnight on the night before the procedure or as directed by your health care provider. Ask your health care  provider if you can take a sip of water with any approved medicines.  On the day of surgery, your health care provider will use a mammogram or ultrasound to locate and mark the tumor in your breast. These markings on your breast will show where the cut (incision) will be made. PROCEDURE   An IV tube will be put into one of your veins.  You may be given medicine to help you relax before the surgery (sedative). You will be given one of the following:  A medicine that numbs the area (local anesthetic).  A medicine that makes you fall asleep (general anesthetic).  Your health care provider will use a kind of electric scalpel that uses heat to minimize bleeding (electrocautery knife).  A curved incision (like a smile or frown) that follows the natural curve of your breast is made, to allow for minimal scarring and better healing.  The tumor will be removed with some of the surrounding tissue. This will be sent to the lab for analysis. Your health care provider may also remove your lymph nodes at this time if needed.  Sometimes, but not always, a rubber tube called a drain will be surgically inserted into your breast area or armpit to collect excess fluid that may accumulate in the space where the tumor was. This drain is connected to a plastic bulb on the outside of your body. This drain creates suction to help remove the fluid.  The incisions will be closed with stitches (sutures).  A bandage may be placed over the incisions. AFTER THE PROCEDURE  You will be taken to the recovery area.  You will be given medicine for pain.  A small rubber drain may be placed in the breast for 2-3 days to prevent a collection of blood (hematoma) from developing in the breast. You will be given instructions on caring for the drain before you go home.  A pressure bandage (dressing) will be applied for 1-2 days to prevent bleeding. Ask your health care provider how to care for your bandage at home.   This  information is not intended to replace advice given to you by your health care provider. Make sure you discuss any questions you have with your health care provider.   Document Released: 12/26/2006 Document Revised: 12/05/2014 Document Reviewed: 04/19/2013 Elsevier Interactive Patient Education [employer].

## 2020-12-23 ENCOUNTER — Encounter: Payer: Self-pay | Admitting: *Deleted

## 2020-12-23 ENCOUNTER — Other Ambulatory Visit
Admission: RE | Admit: 2020-12-23 | Discharge: 2020-12-23 | Disposition: A | Discharge status: Home / Self Care | Payer: 59 | Source: Ambulatory Visit | Attending: Surgery | Admitting: Surgery

## 2020-12-23 ENCOUNTER — Other Ambulatory Visit: Payer: Self-pay

## 2020-12-23 NOTE — Patient Instructions (Signed)
Your procedure is scheduled on: Friday January 01, 2021. Report to Day Surgery inside [facility] (stop by Admission desk first, before going to Radiology then Same Day surgery upstairs). To find out your arrival time please call [phone] between 1PM - 3PM on Thursday December 31, 2020.  Remember: Instructions that are not followed completely may result in serious medical risk,  up to and including death, or upon the discretion of your surgeon and anesthesiologist your  surgery may need to be rescheduled.     _X__ 1. Do not eat food after midnight the night before your procedure.                 No chewing gum or hard candies. You may drink clear liquids up to 2 hours                 before you are scheduled to arrive for your surgery- DO not drink clear                 liquids within 2 hours of the start of your surgery.                 Clear Liquids include:  water, apple juice without pulp, clear Gatorade, G2 or                  Gatorade Zero (avoid Red/Purple/Blue), Black Coffee or Tea (Do not add                 anything to coffee or tea).  __X__2.  On the morning of surgery brush your teeth with toothpaste and water, you                may rinse your mouth with mouthwash if you wish.  Do not swallow any toothpaste of mouthwash.     _X__ 3.  No Alcohol for 24 hours before or after surgery.   _X__ 4.  Do Not Smoke or use e-cigarettes For 24 Hours Prior to Your Surgery.                 Do not use any chewable tobacco products for at least 6 hours prior to                 Surgery.  _X__  5.  Do not use any recreational drugs (marijuana, cocaine, heroin, ecstasy, MDMA or other)                For at least one week prior to your surgery.  Combination of these drugs with anesthesia                May have life threatening results.  __X__ 6.  Notify your doctor if there is any change in your medical condition      (cold, fever, infections).     Do not wear  jewelry, make-up, hairpins, clips or nail polish. Do not wear lotions, powders, or perfumes. . Do not shave 48 hours prior to surgery. Men may shave face and neck. Do not bring valuables to the hospital.    [facility] is not responsible for any belongings or valuables.  Contacts, dentures or bridgework may not be worn into surgery. Leave your suitcase in the car. After surgery it may be brought to your room. For patients admitted to the hospital, discharge time is determined by your treatment team.   Patients discharged the day of surgery will not be allowed to drive  home.   Make arrangements for someone to be with you for the first 24 hours of your Same Day Discharge.   __X__ Take these medicines the morning of surgery with A SIP OF WATER:    1. None   ____ Fleet Enema (as directed)   __X__ Use CHG Soap (or wipes) as directed  __X__ Use lidocaine-prilocaine (EMLA) cream as instructed.   ____ Use Benzoyl Peroxide Gel as instructed  ____ Use inhalers on the day of surgery  ____ Stop metformin 2 days prior to surgery    __X__ Stop Anti-inflammatories such as ibuprofen (ADVIL), Aleve, naproxen, aspirin and or BC powders.    __X__ Stop supplements until after surgery.    __X__ Do not start any herbal supplements before your procedure.   If you have any questions regarding your pre-procedure instructions,  Please call Pre-admit Testing at [phone].

## 2020-12-23 NOTE — Progress Notes (Signed)
Called patient to follow up on her surgical consult.  Molecular studies are still pending.  She did receive her  patient breast cancer educational literature, "My Breast Cancer Treatment Handbook" by Josephine Igo, RN.  No questions or needs at this time.

## 2020-12-24 ENCOUNTER — Encounter
Admission: RE | Admit: 2020-12-24 | Discharge: 2020-12-24 | Disposition: A | Discharge status: Home / Self Care | Payer: 59 | Source: Ambulatory Visit | Attending: Surgery | Admitting: Surgery

## 2020-12-24 ENCOUNTER — Inpatient Hospital Stay: Payer: 59 | Attending: Oncology | Admitting: Oncology

## 2020-12-24 ENCOUNTER — Inpatient Hospital Stay: Payer: 59

## 2020-12-24 ENCOUNTER — Encounter: Payer: Self-pay | Admitting: Oncology

## 2020-12-24 VITALS — BP 138/88 | HR 86 | Temp 98.3°F | Resp 16 | Wt 193.2 lb

## 2020-12-24 DIAGNOSIS — Z79899 Other long term (current) drug therapy: Secondary | ICD-10-CM | POA: Insufficient documentation

## 2020-12-24 DIAGNOSIS — Z01818 Encounter for other preprocedural examination: Secondary | ICD-10-CM | POA: Insufficient documentation

## 2020-12-24 DIAGNOSIS — C50412 Malignant neoplasm of upper-outer quadrant of left female breast: Secondary | ICD-10-CM | POA: Diagnosis not present

## 2020-12-24 DIAGNOSIS — I10 Essential (primary) hypertension: Secondary | ICD-10-CM | POA: Diagnosis not present

## 2020-12-24 DIAGNOSIS — C50912 Malignant neoplasm of unspecified site of left female breast: Secondary | ICD-10-CM

## 2020-12-24 LAB — COMPREHENSIVE METABOLIC PANEL
ALT: 21 U/L (ref 0–44)
AST: 26 U/L (ref 15–41)
Albumin: 4.4 g/dL (ref 3.5–5.0)
Alkaline Phosphatase: 62 U/L (ref 38–126)
Anion gap: 11 (ref 5–15)
BUN: 8 mg/dL (ref 8–23)
CO2: 28 mmol/L (ref 22–32)
Calcium: 9.4 mg/dL (ref 8.9–10.3)
Chloride: 100 mmol/L (ref 98–111)
Creatinine, Ser: 0.83 mg/dL (ref 0.44–1.00)
GFR, Estimated: >60 mL/min (ref >60)
Glucose, Bld: 108 mg/dL — ABNORMAL HIGH (ref 70–99)
Potassium: 3.2 mmol/L — ABNORMAL LOW (ref 3.5–5.1)
Sodium: 139 mmol/L (ref 135–145)
Total Bilirubin: 0.9 mg/dL (ref 0.3–1.2)
Total Protein: 7.7 g/dL (ref 6.5–8.1)

## 2020-12-24 LAB — CBC WITH DIFFERENTIAL/PLATELET
Abs Immature Granulocytes: 0.03 10*3/uL (ref 0.00–0.07)
Basophils Absolute: 0 10*3/uL (ref 0.0–0.1)
Basophils Relative: 0 %
Eosinophils Absolute: 0.1 10*3/uL (ref 0.0–0.5)
Eosinophils Relative: 1 %
HCT: 44.7 % (ref 36.0–46.0)
Hemoglobin: 14.9 g/dL (ref 12.0–15.0)
Immature Granulocytes: 0 %
Lymphocytes Relative: 20 %
Lymphs Abs: 1.6 10*3/uL (ref 0.7–4.0)
MCH: 27 pg (ref 26.0–34.0)
MCHC: 33.3 g/dL (ref 30.0–36.0)
MCV: 81 fL (ref 80.0–100.0)
Monocytes Absolute: 0.6 10*3/uL (ref 0.1–1.0)
Monocytes Relative: 7 %
Neutro Abs: 5.9 10*3/uL (ref 1.7–7.7)
Neutrophils Relative %: 72 %
Platelets: 229 10*3/uL (ref 150–400)
RBC: 5.52 MIL/uL — ABNORMAL HIGH (ref 3.87–5.11)
RDW: 13 % (ref 11.5–15.5)
WBC: 8.2 10*3/uL (ref 4.0–10.5)
nRBC: 0 % (ref 0.0–0.2)

## 2020-12-24 NOTE — Progress Notes (Signed)
New pt referred by DR Wynetta Emery for left female breast cancer.

## 2020-12-24 NOTE — Progress Notes (Signed)
[place]  Telephone:([phone]) [phone] Fax:(336) [phone]  ID: Jennifer Sanders OB: 1956-09-03  MR#: [id number]  PFX#:[id number]  Patient Care Team: Valerie Roys, DO as PCP - General (Family Medicine)  CHIEF COMPLAINT: Stage Ia invasive carcinoma of the upper outer quadrant left breast.  INTERVAL HISTORY: Patient is a 65 year old female who recently underwent routine yearly screening mammogram and was found to have a suspicious lesion.  Subsequent ultrasound biopsy revealed the above-stated malignancy.  She currently feels well and is asymptomatic.  She has no neurologic complaints.  She denies any recent fevers or illnesses.  She has good appetite and denies weight loss.  She has no chest pain, shortness of breath, cough, or hemoptysis.  She denies any nausea, vomiting, constipation, or diarrhea.  She has no urinary complaints.  Patient offers no further specific complaints today.  REVIEW OF SYSTEMS:   Review of Systems  Constitutional: Negative.  Negative for fever and malaise/fatigue.  Respiratory: Negative.  Negative for cough, hemoptysis and shortness of breath.   Cardiovascular: Negative.  Negative for chest pain and leg swelling.  Gastrointestinal: Negative.  Negative for abdominal pain.  Genitourinary: Negative.  Negative for dysuria.  Musculoskeletal: Negative.  Negative for back pain.  Skin: Negative.  Negative for rash.  Neurological: Negative.  Negative for dizziness, focal weakness, weakness and headaches.  Psychiatric/Behavioral: Negative.  The patient is not nervous/anxious.     As per HPI. Otherwise, a complete review of systems is negative.  PAST MEDICAL HISTORY: Past Medical History:  Diagnosis Date  . Bilateral carpal tunnel syndrome   . Diverticulosis    found on colonoscopy on 05/24/16  . Hypertension   . Meralgia paraesthetica, left     PAST SURGICAL HISTORY: Past Surgical History:  Procedure Laterality Date  . CHOLECYSTECTOMY  1990s   . COLONOSCOPY  05/24/2016   internal hemorrhoids, diverticulosis    FAMILY HISTORY: Family History  Problem Relation Age of Onset  . Hyperlipidemia Mother   . Cancer Mother        lung  . Cancer Father        esophageal  . Throat cancer Maternal Grandmother   . Breast cancer Neg Hx     ADVANCED DIRECTIVES (Y/N):  N  HEALTH MAINTENANCE: Social History   Tobacco Use  . Smoking status: Never Smoker  . Smokeless tobacco: Never Used  Vaping Use  . Vaping Use: Never used  Substance Use Topics  . Alcohol use: No  . Drug use: No     Colonoscopy:  PAP:  Bone density:  Lipid panel:  No Known Allergies  Current Outpatient Medications  Medication Sig Dispense Refill  . acetaminophen (TYLENOL) 500 MG tablet Take 500-1,000 mg by mouth every 6 (six) hours as needed (pain).    Marland Kitchen lisinopril-hydrochlorothiazide (ZESTORETIC) 20-12.5 MG tablet Take 1 tablet by mouth daily. 90 tablet 1  . pyridoxine (B-6) 200 MG tablet Take 200 mg by mouth daily.    Marland Kitchen desonide (DESOWEN) 0.05 % lotion Apply 1 application topically 2 (two) times daily as needed (skin irritation.). (Patient not taking: No sig reported)    . etodolac (LODINE) 500 MG tablet Take 1 tablet (500 mg total) by mouth 2 (two) times daily as needed. (Patient not taking: No sig reported) 60 tablet 3  . ibuprofen (ADVIL) 200 MG tablet Take 400-600 mg by mouth every 6 (six) hours as needed (pain.). (Patient not taking: No sig reported)    . lidocaine-prilocaine (EMLA) cream Apply to the areola  of the surgical breast and cover with plastic wrap one hour prior to leaving for surgery. (Patient not taking: Reported on 12/24/2020) 5 g 0  . tobramycin-dexamethasone (TOBRADEX) ophthalmic ointment Place 1 application into both eyes 3 (three) times daily. Use for flares of skin irritation around the eyes (Patient not taking: No sig reported) 3.5 g 0   No current facility-administered medications for this visit.    OBJECTIVE: Vitals:    12/24/20 1327  BP: 138/88  Pulse: 86  Resp: 16  Temp: 98.3 F (36.8 C)  SpO2: 98%     Body mass index is 37.73 kg/m.    ECOG FS:0 - Asymptomatic  General: Well-developed, well-nourished, no acute distress. Eyes: Pink conjunctiva, anicteric sclera. HEENT: Normocephalic, moist mucous membranes. Lungs: No audible wheezing or coughing. Heart: Regular rate and rhythm. Abdomen: Soft, nontender, no obvious distention. Musculoskeletal: No edema, cyanosis, or clubbing. Neuro: Alert, answering all questions appropriately. Cranial nerves grossly intact. Skin: No rashes or petechiae noted. Psych: Normal affect. Lymphatics: No cervical, calvicular, axillary or inguinal LAD.   LAB RESULTS:  Lab Results  Component Value Date   NA 139 12/24/2020   K 3.2 (L) 12/24/2020   CL 100 12/24/2020   CO2 28 12/24/2020   GLUCOSE 108 (H) 12/24/2020   BUN 8 12/24/2020   CREATININE 0.83 12/24/2020   CALCIUM 9.4 12/24/2020   PROT 7.7 12/24/2020   ALBUMIN 4.4 12/24/2020   AST 26 12/24/2020   ALT 21 12/24/2020   ALKPHOS 62 12/24/2020   BILITOT 0.9 12/24/2020   GFRNONAA >60 12/24/2020   GFRAA 77 09/16/2020    Lab Results  Component Value Date   WBC 8.2 12/24/2020   NEUTROABS 5.9 12/24/2020   HGB 14.9 12/24/2020   HCT 44.7 12/24/2020   MCV 81.0 12/24/2020   PLT 229 12/24/2020     STUDIES: US BREAST LTD UNI LEFT INC AXILLA  Result Date: 12/16/2020 CLINICAL DATA:  Patient returns today to evaluate a possible LEFT breast mass identified on recent screening mammogram. EXAM: DIGITAL DIAGNOSTIC LEFT MAMMOGRAM WITH CAD AND TOMOSYNTHESIS ULTRASOUND BREAST LEFT TECHNIQUE: Left digital diagnostic mammography, ultrasound and breast tomosynthesis was performed. Digital images of the breast were evaluated with computer-aided detection. Targeted ultrasound examination of the breast was performed. COMPARISON:  Previous exams including recent screening mammogram dated 12/07/2020. ACR Breast Density Category  b: There are scattered areas of fibroglandular density. FINDINGS: On today's additional diagnostic views, including spot compression with 3D tomosynthesis, a spiculated mass is confirmed within the upper-outer quadrant of the LEFT breast, at middle depth, measuring approximately 1 cm greatest dimension. Targeted ultrasound is performed, showing an irregular hypoechoic mass in the LEFT breast at the 12:30 o'clock axis, 8 cm from the nipple, with partially indistinct margins, measuring 1 x 0.8 x 0.9 cm, corresponding to the mammographic finding. LEFT axilla was evaluated with ultrasound showing no enlarged or morphologically abnormal lymph nodes. IMPRESSION: Highly suspicious mass within the LEFT breast at the 12:30 o'clock axis, 8 cm from the nipple, measuring 1 cm, corresponding to the mammographic finding. Ultrasound-guided biopsy is recommended. RECOMMENDATION: Ultrasound-guided biopsy of the LEFT breast mass at the 12:30 o'clock axis, 8 cm from the nipple, measuring 1 cm. Ordering physician will be contacted and patient will be scheduled for ultrasound-guided biopsy at her convenience. I have discussed the findings and recommendations with the patient. If applicable, a reminder letter will be sent to the patient regarding the next appointment. BI-RADS CATEGORY  5: Highly suggestive of malignancy. Electronically Signed  By: Franki Cabot M.D.   On: 12/16/2020 13:56   MM DIAG BREAST TOMO UNI LEFT  Result Date: 12/16/2020 CLINICAL DATA:  Patient returns today to evaluate a possible LEFT breast mass identified on recent screening mammogram. EXAM: DIGITAL DIAGNOSTIC LEFT MAMMOGRAM WITH CAD AND TOMOSYNTHESIS ULTRASOUND BREAST LEFT TECHNIQUE: Left digital diagnostic mammography, ultrasound and breast tomosynthesis was performed. Digital images of the breast were evaluated with computer-aided detection. Targeted ultrasound examination of the breast was performed. COMPARISON:  Previous exams including recent  screening mammogram dated 12/07/2020. ACR Breast Density Category b: There are scattered areas of fibroglandular density. FINDINGS: On today's additional diagnostic views, including spot compression with 3D tomosynthesis, a spiculated mass is confirmed within the upper-outer quadrant of the LEFT breast, at middle depth, measuring approximately 1 cm greatest dimension. Targeted ultrasound is performed, showing an irregular hypoechoic mass in the LEFT breast at the 12:30 o'clock axis, 8 cm from the nipple, with partially indistinct margins, measuring 1 x 0.8 x 0.9 cm, corresponding to the mammographic finding. LEFT axilla was evaluated with ultrasound showing no enlarged or morphologically abnormal lymph nodes. IMPRESSION: Highly suspicious mass within the LEFT breast at the 12:30 o'clock axis, 8 cm from the nipple, measuring 1 cm, corresponding to the mammographic finding. Ultrasound-guided biopsy is recommended. RECOMMENDATION: Ultrasound-guided biopsy of the LEFT breast mass at the 12:30 o'clock axis, 8 cm from the nipple, measuring 1 cm. Ordering physician will be contacted and patient will be scheduled for ultrasound-guided biopsy at her convenience. I have discussed the findings and recommendations with the patient. If applicable, a reminder letter will be sent to the patient regarding the next appointment. BI-RADS CATEGORY  5: Highly suggestive of malignancy. Electronically Signed   By: Franki Cabot M.D.   On: 12/16/2020 13:56   MM 3D SCREEN BREAST BILATERAL  Addendum Date: 12/08/2020   ADDENDUM REPORT: 12/08/2020 10:05 ADDENDUM: More recent prior exams have become available for comparison. The finding on the left is still of concern. There is no change to the current impression, recommendation or BI-RADS category. Patient will need follow-up diagnostic left breast imaging. Electronically Signed   By: Lajean Manes M.D.   On: 12/08/2020 10:05   Result Date: 12/08/2020 CLINICAL DATA:  Screening. EXAM:  DIGITAL SCREENING BILATERAL MAMMOGRAM WITH TOMO AND CAD COMPARISON:  Previous exam(s). ACR Breast Density Category b: There are scattered areas of fibroglandular density. FINDINGS: In the left breast, a possible mass warrants further evaluation. In the right breast, no findings suspicious for malignancy. Images were processed with CAD. IMPRESSION: Further evaluation is suggested for a possible mass in the left breast. RECOMMENDATION: Diagnostic mammogram and possibly ultrasound of the left breast. (Code:[id number]) The patient will be contacted regarding the findings, and additional imaging will be scheduled. BI-RADS CATEGORY  0: Incomplete. Need additional imaging evaluation and/or prior mammograms for comparison. Electronically Signed: By: Lajean Manes M.D. On: 12/07/2020 13:51   MM Outside Films Mammo  Result Date: 12/08/2020 This examination belongs to an outside facility and is stored here for comparison purposes only.  Contact the originating outside institution for any associated report or interpretation.  MM Outside Films Mammo  Result Date: 12/08/2020 This examination belongs to an outside facility and is stored here for comparison purposes only.  Contact the originating outside institution for any associated report or interpretation.  MM Outside Films Mammo  Result Date: 12/08/2020 This examination belongs to an outside facility and is stored here for comparison purposes only.  Contact the originating outside institution  for any associated report or interpretation.   ASSESSMENT: Stage Ia invasive carcinoma of the upper outer quadrant left breast.  PLAN:    1. Stage Ia invasive carcinoma of the upper outer quadrant left breast: ER/PR and HER-2 are pending at time of dictation.  Given the small size of patient's lesion of 1 cm agree with proceeding with surgery which is currently scheduled for January 01, 2021.  Patient intends to have a lumpectomy, therefore she will likely require  adjuvant XRT.  The need for chemotherapy is unclear until the final pathology has resulted.  Will send Oncotype DX score if appropriate.  No further interventions are needed at this time.  Return to clinic approximately 2 weeks after her surgery to discuss her final pathology results and treatment planning.  Patient also have consultation with radiation oncology that day.  I spent a total of 60 minutes reviewing chart data, face-to-face evaluation with the patient, counseling and coordination of care as detailed above.   Patient expressed understanding and was in agreement with this plan. She also understands that She can call clinic at any time with any questions, concerns, or complaints.   Cancer Staging No matching staging information was found for the patient.  Lloyd Huger, MD   12/24/2020 2:26 PM

## 2020-12-25 LAB — SURGICAL PATHOLOGY

## 2020-12-28 ENCOUNTER — Other Ambulatory Visit: Payer: Self-pay

## 2020-12-28 ENCOUNTER — Ambulatory Visit
Admission: RE | Admit: 2020-12-28 | Discharge: 2020-12-28 | Disposition: A | Discharge status: Home / Self Care | Payer: 59 | Source: Ambulatory Visit | Attending: Surgery | Admitting: Surgery

## 2020-12-28 DIAGNOSIS — C50919 Malignant neoplasm of unspecified site of unspecified female breast: Secondary | ICD-10-CM | POA: Insufficient documentation

## 2020-12-30 ENCOUNTER — Other Ambulatory Visit: Payer: Self-pay

## 2020-12-30 ENCOUNTER — Other Ambulatory Visit
Admission: RE | Admit: 2020-12-30 | Discharge: 2020-12-30 | Disposition: A | Discharge status: Home / Self Care | Payer: 59 | Source: Ambulatory Visit | Attending: Surgery | Admitting: Surgery

## 2020-12-30 DIAGNOSIS — Z01812 Encounter for preprocedural laboratory examination: Secondary | ICD-10-CM | POA: Insufficient documentation

## 2020-12-30 DIAGNOSIS — Z20822 Contact with and (suspected) exposure to covid-19: Secondary | ICD-10-CM | POA: Insufficient documentation

## 2020-12-30 LAB — SARS CORONAVIRUS 2 (TAT 6-24 HRS): SARS Coronavirus 2: NEGATIVE

## 2020-12-31 MED ORDER — CHLORHEXIDINE GLUCONATE CLOTH 2 % EX PADS: 6.0000 | MEDICATED_PAD | Freq: Once | CUTANEOUS | Status: DC

## 2020-12-31 MED ORDER — ACETAMINOPHEN 500 MG PO TABS
1000.0000 mg | ORAL_TABLET | ORAL | Status: AC
  Administered 2021-01-01: 1000 mg via ORAL

## 2020-12-31 MED ORDER — LACTATED RINGERS IV SOLN: INTRAVENOUS | Status: DC

## 2020-12-31 MED ORDER — CELECOXIB 200 MG PO CAPS
200.0000 mg | ORAL_CAPSULE | ORAL | Status: AC
  Administered 2021-01-01: 200 mg via ORAL

## 2020-12-31 MED ORDER — CEFAZOLIN SODIUM-DEXTROSE 2-4 GM/100ML-% IV SOLN
2.0000 g | INTRAVENOUS | Status: AC
  Administered 2021-01-01: 2 g via INTRAVENOUS

## 2020-12-31 MED ORDER — GABAPENTIN 300 MG PO CAPS
300.0000 mg | ORAL_CAPSULE | ORAL | Status: AC
  Administered 2021-01-01: 300 mg via ORAL

## 2020-12-31 MED ORDER — ORAL CARE MOUTH RINSE: 15.0000 mL | Freq: Once | OROMUCOSAL | Status: AC

## 2020-12-31 MED ORDER — CHLORHEXIDINE GLUCONATE CLOTH 2 % EX PADS
6.0000 | MEDICATED_PAD | Freq: Once | CUTANEOUS | Status: AC
  Administered 2021-01-01: 6 via TOPICAL

## 2020-12-31 MED ORDER — FAMOTIDINE 20 MG PO TABS
20.0000 mg | ORAL_TABLET | Freq: Once | ORAL | Status: AC
  Administered 2021-01-01: 20 mg via ORAL

## 2020-12-31 MED ORDER — BUPIVACAINE LIPOSOME 1.3 % IJ SUSP: 20.0000 mL | Freq: Once | INTRAMUSCULAR | Status: DC

## 2020-12-31 MED ORDER — CHLORHEXIDINE GLUCONATE 0.12 % MT SOLN
15.0000 mL | Freq: Once | OROMUCOSAL | Status: AC
  Administered 2021-01-01: 15 mL via OROMUCOSAL

## 2021-01-01 ENCOUNTER — Ambulatory Visit
Admission: RE | Admit: 2021-01-01 | Discharge: 2021-01-01 | Disposition: A | Discharge status: Home / Self Care | Payer: 59 | Source: Ambulatory Visit | Attending: Surgery | Admitting: Surgery

## 2021-01-01 ENCOUNTER — Ambulatory Visit
Admission: RE | Admit: 2021-01-01 | Discharge: 2021-01-01 | Disposition: A | Discharge status: Home / Self Care | Payer: 59 | Attending: Surgery | Admitting: Surgery

## 2021-01-01 ENCOUNTER — Other Ambulatory Visit: Payer: Self-pay

## 2021-01-01 ENCOUNTER — Ambulatory Visit: Payer: 59 | Admitting: Anesthesiology

## 2021-01-01 ENCOUNTER — Encounter: Payer: Self-pay | Admitting: Surgery

## 2021-01-01 ENCOUNTER — Encounter
Admission: RE | Disposition: A | Discharge status: Home / Self Care | Payer: Self-pay | Source: Home / Self Care | Attending: Surgery

## 2021-01-01 DIAGNOSIS — Z170 Estrogen receptor positive status [ER+]: Secondary | ICD-10-CM

## 2021-01-01 DIAGNOSIS — C50412 Malignant neoplasm of upper-outer quadrant of left female breast: Secondary | ICD-10-CM | POA: Insufficient documentation

## 2021-01-01 DIAGNOSIS — Z801 Family history of malignant neoplasm of trachea, bronchus and lung: Secondary | ICD-10-CM | POA: Diagnosis not present

## 2021-01-01 DIAGNOSIS — Z79899 Other long term (current) drug therapy: Secondary | ICD-10-CM | POA: Insufficient documentation

## 2021-01-01 DIAGNOSIS — Z8349 Family history of other endocrine, nutritional and metabolic diseases: Secondary | ICD-10-CM | POA: Diagnosis not present

## 2021-01-01 DIAGNOSIS — C50919 Malignant neoplasm of unspecified site of unspecified female breast: Secondary | ICD-10-CM

## 2021-01-01 HISTORY — PX: BREAST LUMPECTOMY,RADIO FREQ LOCALIZER,AXILLARY SENTINEL LYMPH NODE BIOPSY: SHX6900

## 2021-01-01 HISTORY — PX: BREAST LUMPECTOMY: SHX2

## 2021-01-01 SURGERY — BREAST LUMPECTOMY,RADIO FREQ LOCALIZER,AXILLARY SENTINEL LYMPH NODE BIOPSY
Anesthesia: General | Site: Breast | Laterality: Left

## 2021-01-01 MED ORDER — OXYCODONE HCL 5 MG PO TABS: 5.0000 mg | ORAL_TABLET | Freq: Once | ORAL | Status: AC | PRN

## 2021-01-01 MED ORDER — PROPOFOL 10 MG/ML IV BOLUS
INTRAVENOUS | Status: AC
  Filled 2021-01-01: qty 20

## 2021-01-01 MED ORDER — MEPERIDINE HCL 50 MG/ML IJ SOLN: 6.2500 mg | INTRAMUSCULAR | Status: DC | PRN

## 2021-01-01 MED ORDER — ISOSULFAN BLUE 1 % ~~LOC~~ SOLN
SUBCUTANEOUS | Status: AC
  Filled 2021-01-01: qty 5

## 2021-01-01 MED ORDER — BUPIVACAINE-EPINEPHRINE (PF) 0.25% -1:200000 IJ SOLN
INTRAMUSCULAR | Status: AC
  Filled 2021-01-01: qty 30

## 2021-01-01 MED ORDER — PROMETHAZINE HCL 25 MG/ML IJ SOLN: 6.2500 mg | INTRAMUSCULAR | Status: DC | PRN

## 2021-01-01 MED ORDER — PROPOFOL 10 MG/ML IV BOLUS
INTRAVENOUS | Status: DC | PRN
  Administered 2021-01-01: 200 mg via INTRAVENOUS

## 2021-01-01 MED ORDER — ONDANSETRON HCL 4 MG/2ML IJ SOLN
INTRAMUSCULAR | Status: AC
  Filled 2021-01-01: qty 2

## 2021-01-01 MED ORDER — FENTANYL CITRATE (PF) 100 MCG/2ML IJ SOLN
INTRAMUSCULAR | Status: DC | PRN
  Administered 2021-01-01 (×4): 50 ug via INTRAVENOUS

## 2021-01-01 MED ORDER — MIDAZOLAM HCL 2 MG/2ML IJ SOLN
INTRAMUSCULAR | Status: AC
  Filled 2021-01-01: qty 2

## 2021-01-01 MED ORDER — MIDAZOLAM HCL 2 MG/2ML IJ SOLN
INTRAMUSCULAR | Status: DC | PRN
  Administered 2021-01-01: 2 mg via INTRAVENOUS

## 2021-01-01 MED ORDER — DEXAMETHASONE SODIUM PHOSPHATE 10 MG/ML IJ SOLN
INTRAMUSCULAR | Status: DC | PRN
  Administered 2021-01-01: 10 mg via INTRAVENOUS

## 2021-01-01 MED ORDER — EPHEDRINE SULFATE 50 MG/ML IJ SOLN
INTRAMUSCULAR | Status: DC | PRN
  Administered 2021-01-01: 10 mg via INTRAVENOUS

## 2021-01-01 MED ORDER — OXYCODONE HCL 5 MG PO TABS
ORAL_TABLET | ORAL | Status: AC
  Administered 2021-01-01: 5 mg via ORAL
  Filled 2021-01-01: qty 1

## 2021-01-01 MED ORDER — BUPIVACAINE LIPOSOME 1.3 % IJ SUSP
INTRAMUSCULAR | Status: AC
  Filled 2021-01-01: qty 20

## 2021-01-01 MED ORDER — FENTANYL CITRATE (PF) 100 MCG/2ML IJ SOLN
INTRAMUSCULAR | Status: AC
  Filled 2021-01-01: qty 2

## 2021-01-01 MED ORDER — HYDROMORPHONE HCL 1 MG/ML IJ SOLN: 0.2500 mg | INTRAMUSCULAR | Status: DC | PRN

## 2021-01-01 MED ORDER — OXYCODONE HCL 5 MG/5ML PO SOLN: 5.0000 mg | Freq: Once | ORAL | Status: AC | PRN

## 2021-01-01 MED ORDER — DROPERIDOL 2.5 MG/ML IJ SOLN
0.6250 mg | Freq: Once | INTRAMUSCULAR | Status: DC | PRN
  Filled 2021-01-01: qty 2

## 2021-01-01 MED ORDER — ONDANSETRON HCL 4 MG/2ML IJ SOLN
INTRAMUSCULAR | Status: DC | PRN
  Administered 2021-01-01: 4 mg via INTRAVENOUS

## 2021-01-01 MED ORDER — HYDROCODONE-ACETAMINOPHEN 5-325 MG PO TABS: 1.0000 | ORAL_TABLET | Freq: Four times a day (QID) | ORAL | 0 refills | Status: DC | PRN

## 2021-01-01 MED ORDER — LIDOCAINE HCL (CARDIAC) PF 100 MG/5ML IV SOSY
PREFILLED_SYRINGE | INTRAVENOUS | Status: DC | PRN
  Administered 2021-01-01: 100 mg via INTRAVENOUS

## 2021-01-01 MED ORDER — BUPIVACAINE-EPINEPHRINE (PF) 0.25% -1:200000 IJ SOLN
INTRAMUSCULAR | Status: DC | PRN
  Administered 2021-01-01: 30 mL

## 2021-01-01 MED ORDER — TECHNETIUM TC 99M TILMANOCEPT KIT
1.0800 | PACK | Freq: Once | INTRAVENOUS | Status: AC | PRN
  Administered 2021-01-01: 1.08 via INTRADERMAL

## 2021-01-01 MED ORDER — LORAZEPAM 2 MG/ML IJ SOLN: 1.0000 mg | Freq: Once | INTRAMUSCULAR | Status: DC | PRN

## 2021-01-01 SURGICAL SUPPLY — 42 items
ADH SKN CLS APL DERMABOND .7 (GAUZE/BANDAGES/DRESSINGS) ×2
APL PRP STRL LF DISP 70% ISPRP (MISCELLANEOUS) ×1
APPLIER CLIP 9.375 SM OPEN (CLIP) ×2
APR CLP SM 9.3 20 MLT OPN (CLIP) ×1
BLADE SURG 15 STRL LF DISP TIS (BLADE) ×1 IMPLANT
BLADE SURG 15 STRL SS (BLADE) ×2
CANISTER SUCT 1200ML W/VALVE (MISCELLANEOUS) IMPLANT
CHLORAPREP W/TINT 26 (MISCELLANEOUS) ×2 IMPLANT
CLIP APPLIE 9.375 SM OPEN (CLIP) ×1 IMPLANT
CNTNR SPEC 2.5X3XGRAD LEK (MISCELLANEOUS)
CONT SPEC 4OZ STER OR WHT (MISCELLANEOUS)
CONT SPEC 4OZ STRL OR WHT (MISCELLANEOUS)
CONTAINER SPEC 2.5X3XGRAD LEK (MISCELLANEOUS) IMPLANT
COVER WAND RF STERILE (DRAPES) IMPLANT
DECANTER SPIKE VIAL GLASS SM (MISCELLANEOUS) IMPLANT
DERMABOND ADVANCED (GAUZE/BANDAGES/DRESSINGS) ×2
DERMABOND ADVANCED .7 DNX12 (GAUZE/BANDAGES/DRESSINGS) ×2 IMPLANT
DEVICE DUBIN SPECIMEN MAMMOGRA (MISCELLANEOUS) ×2 IMPLANT
DRAPE LAPAROTOMY TRNSV 106X77 (MISCELLANEOUS) ×2 IMPLANT
ELECT CAUTERY BLADE TIP 2.5 (TIP) ×2
ELECT REM PT RETURN 9FT ADLT (ELECTROSURGICAL) ×2
ELECTRODE CAUTERY BLDE TIP 2.5 (TIP) ×1 IMPLANT
ELECTRODE REM PT RTRN 9FT ADLT (ELECTROSURGICAL) ×1 IMPLANT
GLOVE ORTHO TXT STRL SZ7.5 (GLOVE) ×2 IMPLANT
GOWN STRL REUS W/ TWL LRG LVL3 (GOWN DISPOSABLE) ×2 IMPLANT
GOWN STRL REUS W/TWL LRG LVL3 (GOWN DISPOSABLE) ×4
KIT MARKER MARGIN INK (KITS) ×2 IMPLANT
KIT TURNOVER KIT A (KITS) ×2 IMPLANT
MANIFOLD NEPTUNE II (INSTRUMENTS) ×2 IMPLANT
NEEDLE HYPO 22GX1.5 SAFETY (NEEDLE) ×2 IMPLANT
PACK BASIN MINOR ARMC (MISCELLANEOUS) ×2 IMPLANT
SET LOCALIZER 20 PROBE US (MISCELLANEOUS) ×2 IMPLANT
SET WALTER ACTIVATION W/DRAPE (SET/KITS/TRAYS/PACK) IMPLANT
SLEVE PROBE SENORX GAMMA FIND (MISCELLANEOUS) ×2 IMPLANT
SUT MNCRL 4-0 (SUTURE) ×2
SUT MNCRL 4-0 27XMFL (SUTURE) ×1
SUT VIC AB 3-0 SH 27 (SUTURE) ×2
SUT VIC AB 3-0 SH 27X BRD (SUTURE) ×1 IMPLANT
SUTURE MNCRL 4-0 27XMF (SUTURE) ×1 IMPLANT
SYR 10ML LL (SYRINGE) ×2 IMPLANT
SYR 20ML LL LF (SYRINGE) ×2 IMPLANT
WATER STERILE IRR 1000ML POUR (IV SOLUTION) ×2 IMPLANT

## 2021-01-01 NOTE — Discharge Instructions (Signed)

## 2021-01-01 NOTE — Transfer of Care (Signed)
Immediate Anesthesia Transfer of Care Note  Patient: Jennifer Sanders  Procedure(s) Performed: BREAST [place] SENTINEL LYMPH NODE BIOPSY (Left Breast)  Patient Location: PACU  Anesthesia Type:General  Level of Consciousness: awake, alert  and oriented  Airway & Oxygen Therapy: Patient Spontanous Breathing and Patient connected to face mask oxygen  Post-op Assessment: Report given to RN and Post -op Vital signs reviewed and stable  Post vital signs: Reviewed and stable  Last Vitals:  Vitals Value Taken Time  BP 118/92 01/01/21 1049  Temp    Pulse 89 01/01/21 1049  Resp 19 01/01/21 1049  SpO2 100 % 01/01/21 1049  Vitals shown include unvalidated device data.  Last Pain:  Vitals:   01/01/21 0829  TempSrc: Temporal  PainSc: 0-No pain         Complications: No complications documented.

## 2021-01-01 NOTE — Anesthesia Postprocedure Evaluation (Signed)
Anesthesia Post Note  Patient: Jennifer Sanders  Procedure(s) Performed: BREAST LUMPECTOMY,RADIO FREQ LOCALIZER,AXILLARY SENTINEL LYMPH NODE BIOPSY (Left Breast)  Patient location during evaluation: PACU Anesthesia Type: General Level of consciousness: awake Pain management: pain level controlled Vital Signs Assessment: post-procedure vital signs reviewed and stable Respiratory status: spontaneous breathing Cardiovascular status: blood pressure returned to baseline and stable Postop Assessment: no apparent nausea or vomiting Anesthetic complications: no   No complications documented.   Last Vitals:  Vitals:   01/01/21 0829 01/01/21 1049  BP: 131/79 (!) 118/92  Pulse: 91 89  Resp: 18 19  Temp: (!) 36.1 C 36.7 C  SpO2: 98% 100%    Last Pain:  Vitals:   01/01/21 1049  TempSrc:   PainSc: 0-No pain                 Neva Seat

## 2021-01-01 NOTE — Interval H&P Note (Signed)
History and Physical Interval Note:  01/01/2021 8:50 AM  Jennifer Sanders  has presented today for surgery, with the diagnosis of left breast cancer.  The various methods of treatment have been discussed with the patient and family. After consideration of risks, benefits and other options for treatment, the patient has consented to  Procedure(s): [place] (Left) as a surgical intervention.  The patient's history has been reviewed, patient examined, no change in status, stable for surgery.  I have reviewed the patient's chart and labs.  Questions were answered to the patient's satisfaction.   Left side is marked.   Ronny Bacon

## 2021-01-01 NOTE — Anesthesia Procedure Notes (Signed)
Performed by: Reyn Faivre E, CRNA       

## 2021-01-01 NOTE — Anesthesia Procedure Notes (Signed)
Performed by: Amilee Janvier E, CRNA       

## 2021-01-01 NOTE — Anesthesia Preprocedure Evaluation (Signed)
Anesthesia Evaluation  Patient identified by MRN, date of birth, ID band Patient awake    Reviewed: Allergy & Precautions, H&P , NPO status , Patient's Chart, lab work & pertinent test results  Airway Mallampati: II       Dental no notable dental hx. (+) Teeth Intact   Pulmonary neg pulmonary ROS,    Pulmonary exam normal breath sounds clear to auscultation       Cardiovascular hypertension, negative cardio ROS Normal cardiovascular exam Rhythm:Regular Rate:Normal     Neuro/Psych  Neuromuscular disease negative psych ROS   GI/Hepatic negative GI ROS, Neg liver ROS,   Endo/Other  negative endocrine ROS  Renal/GU negative Renal ROS  negative genitourinary   Musculoskeletal negative musculoskeletal ROS (+)   Abdominal   Peds negative pediatric ROS (+)  Hematology negative hematology ROS (+)   Anesthesia Other Findings Past Medical History: No date: Bilateral carpal tunnel syndrome No date: Diverticulosis     Comment:  found on colonoscopy on 05/24/16 No date: Hypertension No date: Meralgia paraesthetica, left   Reproductive/Obstetrics negative OB ROS                             Anesthesia Physical Anesthesia Plan  ASA: III  Anesthesia Plan: General   Post-op Pain Management:    Induction: Intravenous  PONV Risk Score and Plan: 3 and Ondansetron and Dexamethasone  Airway Management Planned: LMA  Additional Equipment:   Intra-op Plan:   Post-operative Plan: Extubation in OR  Informed Consent: I have reviewed the patients History and Physical, chart, labs and discussed the procedure including the risks, benefits and alternatives for the proposed anesthesia with the patient or authorized representative who has indicated his/her understanding and acceptance.     Dental advisory given  Plan Discussed with: CRNA, Anesthesiologist and Surgeon  Anesthesia Plan Comments:          Anesthesia Quick Evaluation

## 2021-01-01 NOTE — Op Note (Signed)
  Pre-operative Diagnosis: Breast Cancer, Left    Post-operative Diagnosis: Same  Surgeon: Ronny Bacon, M.D., [facility]  Anesthesia: LMA  Procedure: Left Lumpectomy, RFID tag directed, sentinel node biopsy  Procedure Details  The patient was seen again in the Holding Room. The benefits, complications, treatment options, and expected outcomes were discussed with the patient. The risks of bleeding, infection, recurrence of symptoms, failure to resolve symptoms, hematoma, seroma, open wound, cosmetic deformity, and the need for further surgery were discussed.  The patient was taken to Operating Room, identified as Jennifer Sanders and the procedure verified.  A Time Out was held and the above information confirmed.  Prior to the induction of general anesthesia, antibiotic prophylaxis was administered. VTE prophylaxis was in place. The patient was positioned in the supine position. Appropriate anesthesia was then administered and tolerated well. The LOCALizer is used to mark the skin for incision.  The chest was prepped with Chloraprep and draped in the sterile fashion.  Then using the hand-held probe an area of high counts was identified in the axilla, an incision was made and direction by the probe aided in dissection of a lymph node which was sent for permanent section. One palpable LN found with counts of 5,000.  A second SLN was found with counts of 800.  No other palpable LNs were found and the background counts all less than 500.   Attention was turned to the RFID tag localization site where an incision was made. Dissection using the LOCALizer to perform a lumpectomy with adequate margins was performed. This was done with electrocautery and sharp dissection with Mayo scissors. There was minimal bleeding, and the cavity packed.  The specimen was taken to the back table and painted to demarcate the 6 surfaces of potential margin.   I returned to the cavity to remove the packing, and hemostasis was  confirmed with electrocautery.   Clips applied to mark the locations within the axilla and breast.  Once assuring that hemostasis was adequate and checked multiple times the wound was closed with interrupted 3-0 Vicryl followed by 4-0 subcuticular Monocryl sutures.  The axillary wound was closed in a similar fashion. Dermabond is utilized to seal the incision.    Findings: Faxitron imaging: Confirming markers centrally in specimen.   Estimated Blood Loss: Minimal         Drains: None         Specimens:  Left axillary Sentinel LNs two, sent separately.  L UOQ breast tissue.        Complications: none.          Condition: Stable  Sentinel Node Biopsy Synoptic Operative Report  Operation performed with curative intent:Yes  Tracer(s) used to identify sentinel nodes in the upfront surgery (non-neoadjuvant) setting (select all that apply):Radioactive Tracer  Tracer(s) used to identify sentinel nodes in the neoadjuvant setting (select all that apply):N/A  All nodes (colored or non-colored) present at the end of a dye-filled lymphatic channel were removed:N/A  All significantly radioactive nodes were removed:Yes  All palpable suspicious nodes were removed:Yes  Biopsy-proven positive nodes marked with clips prior to chemotherapy were identified and removed:N/A    Ronny Bacon, M.D., [facility]  01/01/2021 ; 10:56 AM

## 2021-01-04 ENCOUNTER — Encounter: Payer: Self-pay | Admitting: Surgery

## 2021-01-04 ENCOUNTER — Other Ambulatory Visit: Payer: Self-pay | Admitting: Anatomic Pathology & Clinical Pathology

## 2021-01-04 ENCOUNTER — Encounter: Payer: Self-pay | Admitting: *Deleted

## 2021-01-04 LAB — SURGICAL PATHOLOGY

## 2021-01-04 NOTE — Progress Notes (Signed)
mdt  

## 2021-01-04 NOTE — Progress Notes (Signed)
Called patient to follow up post surgery.  States she is doing well.  No complaints of pain.  States she sees the Psychologist, sport and exercise tomorrow, and is just waiting on final pathology results.  No needs at this time.

## 2021-01-07 ENCOUNTER — Ambulatory Visit (INDEPENDENT_AMBULATORY_CARE_PROVIDER_SITE_OTHER): Payer: 59 | Admitting: Surgery

## 2021-01-07 ENCOUNTER — Encounter: Payer: 59 | Admitting: Surgery

## 2021-01-07 ENCOUNTER — Other Ambulatory Visit: Payer: Self-pay

## 2021-01-07 ENCOUNTER — Encounter: Payer: Self-pay | Admitting: Surgery

## 2021-01-07 VITALS — BP 167/111 | HR 98 | Temp 99.6°F | Ht 61.0 in | Wt 191.4 lb

## 2021-01-07 DIAGNOSIS — C50412 Malignant neoplasm of upper-outer quadrant of left female breast: Secondary | ICD-10-CM

## 2021-01-07 NOTE — Progress Notes (Signed)
[facility] POST-OP OFFICE VISIT  01/07/2021  HPI: Jennifer Sanders is a 65 y.o. female 6 days s/p RF ID left breast lumpectomy with sentinel lymph node biopsy. She presents today without complaints of pain, she denies any limitation of range of motion.  She denies any drainage from any of her incisions. Reviewed her pathology which confirms a grade 1 invasive mammary carcinoma, 2 sentinel nodes are negative.  Vital signs: BP (!) 167/111   Pulse 98   Temp 99.6 F (37.6 C) (Oral)   Ht _0  (1.549 m)   Wt 191 lb 6.4 oz (86.8 kg)   SpO2 95%   BMI 36.16 kg/m    Physical Exam: Constitutional: She appears well Axilla: She has excellent range of motion of her left arm, some mild swelling at the axillary incision, soft.  Skin changes with minimal focal ecchymosis.  No suggestion of hematoma. Left breast incision on the medial most aspect is slightly more pink than the remaining part however I believe this is all relative.  There is no tenderness or calor.  There is no discharge.   Assessment/Plan: This is a 65 y.o. female 6 days s/p left breast conservation for grade 1 invasive mammary carcinoma.  ER/PR positive, HER-2 negative. pT1c  Regional Lymph Nodes Modifier: snpN0  pM - Not applicable  Patient Active Problem List   Diagnosis Date Noted  . Malignant neoplasm of upper-outer quadrant of left female breast ([place]) 12/22/2020  . Hypertension   . Bilateral carpal tunnel syndrome   . Meralgia paraesthetica, left   . Diverticulosis     -In 2 weeks she has appointments to see both Dr. Donella Stade and Dr. Grayland Ormond.  I will be glad to see her again as needed in the interim, but otherwise anticipating semiannual to annual follow-up initially. I answered a number of questions of her treatment to come.  She will be reviewed at breast cancer conference on Monday.   Ronny Bacon M.D., FACS 01/07/2021, 10:58 AM

## 2021-01-07 NOTE — Patient Instructions (Addendum)
Please keep your appointments with Dr.Chrystal and Dr.Finnegan. Please call our office with any question or concerns.

## 2021-01-14 ENCOUNTER — Encounter: Payer: Self-pay | Admitting: Surgery

## 2021-01-14 ENCOUNTER — Ambulatory Visit (INDEPENDENT_AMBULATORY_CARE_PROVIDER_SITE_OTHER): Payer: 59 | Admitting: Surgery

## 2021-01-14 ENCOUNTER — Other Ambulatory Visit: Payer: Self-pay

## 2021-01-14 VITALS — BP 154/89 | HR 86 | Temp 99.2°F | Ht 61.0 in | Wt 193.6 lb

## 2021-01-14 DIAGNOSIS — C50412 Malignant neoplasm of upper-outer quadrant of left female breast: Secondary | ICD-10-CM

## 2021-01-14 NOTE — Patient Instructions (Addendum)
Please keep a dressing over the area until all drainage has stopped. Please see your follow up appointment listed below.

## 2021-01-14 NOTE — Progress Notes (Signed)
[facility] POST-OP OFFICE VISIT  01/14/2021  HPI: Jennifer Sanders is a 65 y.o. female 13 days s/p breast conserving therapy for left breast cancer.  Pathology was previously reviewed on her prior visit, but since then she began having clear yellow drainage from her axillary incision.  She denies any increasing pain or tenderness.  She denies any fevers or chills.  She presents today for evaluation.  Vital signs: BP (!) 154/89   Pulse 86   Temp 99.2 F (37.3 C) (Oral)   Ht 5\' 1"  (1.549 m)   Wt 193 lb 9.6 oz (87.8 kg)   SpO2 97%   BMI 36.58 kg/m    Physical Exam: Constitutional: She appears well, nontoxic and in no distress. Left breast appears without evidence of ecchymosis, erythema or induration the upper outer quadrant incision is intact.  The left axillary incision I cannot appreciate any additional drainage today.  The Dermabond is gone.  There is no evidence of any erythema, induration, discharge or tenderness.   Assessment/Plan: This is a 65 y.o. female 13 days s/p left breast lumpectomy with sentinel lymph node biopsy.  Serous drainage from left axillary incision, anticipating this to be limited.  Patient Active Problem List   Diagnosis Date Noted  . Malignant neoplasm of upper-outer quadrant of left female breast ([place]) 12/22/2020  . Hypertension   . Bilateral carpal tunnel syndrome   . Meralgia paraesthetica, left   . Diverticulosis     -Reassurance is given, I advise she continue keeping a dry gauze or absorptive dressing over the area.  We will have her follow-up in a week to ensure that this is progressing well.   Ronny Bacon M.D., FACS 01/14/2021, 1:41 PM

## 2021-01-17 NOTE — Progress Notes (Signed)
[place]  [phone] Fax:(336) [phone]  ID: Jennifer Sanders OB: Oct 11, 1956  MR#: [id number]  TGG#:[id number]  Patient Care Team: Valerie Roys, DO as PCP - General (Family Medicine)  CHIEF COMPLAINT: Stage Ia ER/PR positive, HER-2 negative invasive carcinoma of the upper outer quadrant left breast.  Oncotype DX score low risk 8.  INTERVAL HISTORY: Patient returns to clinic today for further evaluation and treatment plan.  She underwent lumpectomy on January 01, 2021 confirming the above-stated malignancy.  She tolerated her procedure well without significant side effects.  She continues to have mild tenderness at her surgical site, but otherwise feels well.  She has no neurologic complaints.  She denies any recent fevers or illnesses.  She has a good appetite and denies weight loss.  She has no chest pain, shortness of breath, cough, or hemoptysis.  She denies any nausea, vomiting, constipation, or diarrhea.  She has no urinary complaints.  Patient offers no further specific complaints today.  REVIEW OF SYSTEMS:   Review of Systems  Constitutional: Negative.  Negative for fever and malaise/fatigue.  Respiratory: Negative.  Negative for cough, hemoptysis and shortness of breath.   Cardiovascular: Negative.  Negative for chest pain and leg swelling.  Gastrointestinal: Negative.  Negative for abdominal pain.  Genitourinary: Negative.  Negative for dysuria.  Musculoskeletal: Negative.  Negative for back pain.  Skin: Negative.  Negative for rash.  Neurological: Negative.  Negative for dizziness, focal weakness, weakness and headaches.  Psychiatric/Behavioral: Negative.  The patient is not nervous/anxious.     As per HPI. Otherwise, a complete review of systems is negative.  PAST MEDICAL HISTORY: Past Medical History:  Diagnosis Date  . Bilateral carpal tunnel syndrome   . Diverticulosis    found on colonoscopy on 05/24/16  . Hypertension   .  Meralgia paraesthetica, left     PAST SURGICAL HISTORY: Past Surgical History:  Procedure Laterality Date  . BREAST BIOPSY Left 12/18/2020   [facility]  . BREAST LUMPECTOMY,RADIO FREQ LOCALIZER,AXILLARY SENTINEL LYMPH NODE BIOPSY Left 01/01/2021   Procedure: BREAST LUMPECTOMY,RADIO FREQ LOCALIZER,AXILLARY SENTINEL LYMPH NODE BIOPSY;  Surgeon: Ronny Bacon, MD;  Location: [facility] ORS;  Service: General;  Laterality: Left;  Left axilla  . CHOLECYSTECTOMY  1990s  . COLONOSCOPY  05/24/2016   internal hemorrhoids, diverticulosis    FAMILY HISTORY: Family History  Problem Relation Age of Onset  . Hyperlipidemia Mother   . Cancer Mother        lung  . Cancer Father        esophageal  . Throat cancer Maternal Grandmother   . Breast cancer Neg Hx     ADVANCED DIRECTIVES (Y/N):  N  HEALTH MAINTENANCE: Social History   Tobacco Use  . Smoking status: Never Smoker  . Smokeless tobacco: Never Used  Vaping Use  . Vaping Use: Never used  Substance Use Topics  . Alcohol use: No  . Drug use: No     Colonoscopy:  PAP:  Bone density:  Lipid panel:  No Known Allergies  Current Outpatient Medications  Medication Sig Dispense Refill  . acetaminophen (TYLENOL) 500 MG tablet Take 500-1,000 mg by mouth every 6 (six) hours as needed (pain).    Marland Kitchen desonide (DESOWEN) 0.05 % lotion Apply 1 application topically 2 (two) times daily as needed (skin irritation.).    Marland Kitchen etodolac (LODINE) 500 MG tablet Take 1 tablet (500 mg total) by mouth 2 (two) times daily as needed. 60 tablet 3  . ibuprofen (ADVIL) 200  MG tablet Take 400-600 mg by mouth every 6 (six) hours as needed (pain.).    Marland Kitchen lisinopril-hydrochlorothiazide (ZESTORETIC) 20-12.5 MG tablet Take 1 tablet by mouth daily. 90 tablet 1  . pyridoxine (B-6) 200 MG tablet Take 200 mg by mouth daily.     No current facility-administered medications for this visit.    OBJECTIVE: Vitals:   01/21/21 0944  BP: (!) 145/92  Pulse: 86  Temp: (!) 97.4 F  (36.3 C)     Body mass index is 36.66 kg/m.    ECOG FS:0 - Asymptomatic  General: Well-developed, well-nourished, no acute distress. Eyes: Pink conjunctiva, anicteric sclera. HEENT: Normocephalic, moist mucous membranes. Breast: Left breast with healing surgical scar.  Exam performed by another provider. Lungs: No audible wheezing or coughing. Heart: Regular rate and rhythm. Abdomen: Soft, nontender, no obvious distention. Musculoskeletal: No edema, cyanosis, or clubbing. Neuro: Alert, answering all questions appropriately. Cranial nerves grossly intact. Skin: No rashes or petechiae noted. Psych: Normal affect.  LAB RESULTS:  Lab Results  Component Value Date   NA 139 12/24/2020   K 3.2 (L) 12/24/2020   CL 100 12/24/2020   CO2 28 12/24/2020   GLUCOSE 108 (H) 12/24/2020   BUN 8 12/24/2020   CREATININE 0.83 12/24/2020   CALCIUM 9.4 12/24/2020   PROT 7.7 12/24/2020   ALBUMIN 4.4 12/24/2020   AST 26 12/24/2020   ALT 21 12/24/2020   ALKPHOS 62 12/24/2020   BILITOT 0.9 12/24/2020   GFRNONAA >60 12/24/2020   GFRAA 77 09/16/2020    Lab Results  Component Value Date   WBC 8.2 12/24/2020   NEUTROABS 5.9 12/24/2020   HGB 14.9 12/24/2020   HCT 44.7 12/24/2020   MCV 81.0 12/24/2020   PLT 229 12/24/2020     STUDIES: NM Sentinel Node Inj-No Rpt (Breast)  Result Date: 01/01/2021 Sulfur colloid was injected by the nuclear medicine technologist for melanoma sentinel node.   MM Breast Surgical Specimen  Result Date: 01/01/2021 CLINICAL DATA:  Status post radiofrequency tag localized left breast lumpectomy. EXAM: SPECIMEN RADIOGRAPH OF THE left BREAST COMPARISON:  Previous exam(s). FINDINGS: Status post excision of the left breast. The RF tag and vision shaped clip are present within the specimen. IMPRESSION: Specimen radiograph of the left breast. Electronically Signed   By: Kristopher Oppenheim M.D.   On: 01/01/2021 10:12   MM CLIP PLACEMENT LEFT  Result Date: 12/18/2020 CLINICAL  DATA:  Post biopsy mammogram of the left breast for clip placement. EXAM: DIAGNOSTIC LEFT MAMMOGRAM POST ULTRASOUND BIOPSY COMPARISON:  Previous exam(s). FINDINGS: Mammographic images were obtained following ultrasound guided biopsy of a mass in the left breast at 12:30. The biopsy marking clip is in expected position at the site of biopsy. IMPRESSION: Appropriate positioning of the vision shaped biopsy marking clip at the site of biopsy in the upper-outer left breast. Final Assessment: Post Procedure Mammograms for Marker Placement Electronically Signed   By: Ammie Ferrier M.D.   On: 12/18/2020 08:56   [place] LT BREAST BX W LOC DEV 1ST LESION IMG BX SPEC US GUIDE  Addendum Date: 12/29/2020   ADDENDUM REPORT: 12/22/2020 13:39 ADDENDUM: PATHOLOGY revealed: A. BREAST, LEFT, 12:30 8 CM FROM NIPPLE; ULTRASOUND-GUIDED CORE BIOPSY: - INVASIVE MAMMARY CARCINOMA, NO SPECIAL TYPE. Size of invasive carcinoma: 8 mm in this sample. Grade 1. Ductal carcinoma in situ: Not identified. Lymphovascular invasion: Not identified. Pathology results are CONCORDANT with imaging findings, per Dr. Ammie Ferrier. Pathology results and recommendations below were discussed with patient by telephone on  12/21/2020. Patient reported biopsy site within normal limits with slight tenderness at the site. Post biopsy care instructions were reviewed, questions were answered and my direct phone number was provided to patient. Patient was instructed to call [facility] if any concerns or questions arise related to the biopsy. Recommend surgical consultation: Request for surgical consultation relayed to Al Pimple RN and Tanya Nones RN at [facility] by Electa Sniff RN on 12/21/2020. Pathology results reported by Electa Sniff RN on 12/22/2020. Electronically Signed   By: Ammie Ferrier M.D.   On: 12/22/2020 13:39   Result Date: 12/28/2020 CLINICAL DATA:  65 year old female presenting for ultrasound-guided biopsy of a  left breast mass. EXAM: ULTRASOUND GUIDED LEFT BREAST CORE NEEDLE BIOPSY COMPARISON:  Previous exam(s). PROCEDURE: I met with the patient and we discussed the procedure of ultrasound-guided biopsy, including benefits and alternatives. We discussed the high likelihood of a successful procedure. We discussed the risks of the procedure, including infection, bleeding, tissue injury, clip migration, and inadequate sampling. Informed written consent was given. The usual time-out protocol was performed immediately prior to the procedure. Lesion quadrant: Upper outer quadrant Using sterile technique and 1% Lidocaine as local anesthetic, under direct ultrasound visualization, a 14 gauge spring-loaded device was used to perform biopsy of a mass in the left breast at 12 30 using a lateral approach. At the conclusion of the procedure a vision shaped tissue marker clip was deployed into the biopsy cavity. Follow up 2 view mammogram was performed and dictated separately. IMPRESSION: Ultrasound guided biopsy of a left breast mass at 12:30. No apparent complications. Electronically Signed: By: Ammie Ferrier M.D. On: 12/18/2020 08:42   MM LT RADIO FREQUENCY TAG LOC MAMMO GUIDE  Result Date: 12/28/2020 CLINICAL DATA:  Patient presents for placement of radiofrequency device in the LEFT breast prior to lumpectomy. Recent ultrasound-guided core biopsy of mass in the 12:30 o'clock location of the LEFT breast shows invasive mammary carcinoma. EXAM: MAMMOGRAPHIC GUIDED RADIOFREQUENCY DEVICE LOCALIZATION OF THE LEFT BREAST COMPARISON:  Previous exam(s) FINDINGS: Patient presents for radiofrequency device localization prior to lumpectomy. I met with the patient and we discussed the procedure of radiofrequency device localization including benefits and alternatives. We discussed the high likelihood of a successful procedure. We discussed the risks of the procedure including infection, bleeding, tissue injury and further surgery.  Informed, written consent was given. The usual time-out protocol was performed immediately prior to the procedure. Using mammographic guidance, sterile technique, 1% lidocaine as local anesthesia, a radiofrequency tag was used to localize the vision shaped clip in the UPPER central portion of the LEFT breast using a craniocaudal approach. The follow-up mammogram images confirm that the RF device is in the expected location and are marked for Dr. Christian Mate. Follow-up survey of the patient confirms the presence of the RF device. The patient tolerated the procedure well and was released from the [facility]. IMPRESSION: Radiofrequency device localization of the LEFT breast. No apparent complications. Electronically Signed   By: Nolon Nations M.D.   On: 12/28/2020 16:29    ASSESSMENT: Stage Ia ER/PR positive, HER-2 negative invasive carcinoma of the upper outer quadrant left breast.  Oncotype DX score low risk 8.  PLAN:    1. Stage Ia ER/PR positive, HER-2 negative invasive carcinoma of the upper outer quadrant left breast: Oncotype DX score low risk 8.  Given patient's stage of disease and low risk Oncotype, she does not require adjuvant chemotherapy.  She had consultation with radiation oncology today  and will proceed with adjuvant XRT in approximately 1 week.  Given the ER/PR status of her malignancy she will benefit from an aromatase inhibitor at the conclusion of her treatment.  Return to clinic at the end of XRT for further evaluation and initiation of letrozole.    I spent a total of 30 minutes reviewing chart data, face-to-face evaluation with the patient, counseling and coordination of care as detailed above.   Patient expressed understanding and was in agreement with this plan. She also understands that She can call clinic at any time with any questions, concerns, or complaints.   Cancer Staging Malignant neoplasm of upper-outer quadrant of left female breast [place]) Staging form: Breast,  AJCC 8th Edition - Pathologic stage from 01/22/2021: Stage IA (pT1c, pN0, cM0, G1, ER+, PR+, HER2-, Oncotype DX score: 8) - Signed by Lloyd Huger, MD on 01/22/2021 Stage prefix: Initial diagnosis Multigene prognostic tests performed: Oncotype DX Recurrence score range: Less than 11 Histologic grading system: 3 grade system   Lloyd Huger, MD   01/22/2021 5:50 AM

## 2021-01-21 ENCOUNTER — Inpatient Hospital Stay: Payer: 59 | Attending: Oncology | Admitting: Oncology

## 2021-01-21 ENCOUNTER — Encounter: Payer: Self-pay | Admitting: Oncology

## 2021-01-21 ENCOUNTER — Encounter: Payer: Self-pay | Admitting: Radiation Oncology

## 2021-01-21 ENCOUNTER — Ambulatory Visit
Admission: RE | Admit: 2021-01-21 | Discharge: 2021-01-21 | Disposition: A | Discharge status: Home / Self Care | Payer: 59 | Source: Ambulatory Visit | Attending: Radiation Oncology | Admitting: Radiation Oncology

## 2021-01-21 VITALS — BP 145/92 | HR 86 | Temp 97.4°F | Wt 194.0 lb

## 2021-01-21 DIAGNOSIS — C50412 Malignant neoplasm of upper-outer quadrant of left female breast: Secondary | ICD-10-CM | POA: Diagnosis not present

## 2021-01-21 DIAGNOSIS — C50912 Malignant neoplasm of unspecified site of left female breast: Secondary | ICD-10-CM

## 2021-01-21 DIAGNOSIS — Z170 Estrogen receptor positive status [ER+]: Secondary | ICD-10-CM | POA: Insufficient documentation

## 2021-01-21 DIAGNOSIS — Z79899 Other long term (current) drug therapy: Secondary | ICD-10-CM | POA: Diagnosis not present

## 2021-01-21 DIAGNOSIS — G5712 Meralgia paresthetica, left lower limb: Secondary | ICD-10-CM | POA: Insufficient documentation

## 2021-01-21 DIAGNOSIS — I10 Essential (primary) hypertension: Secondary | ICD-10-CM | POA: Insufficient documentation

## 2021-01-21 NOTE — Progress Notes (Signed)
Pt seen in radiation oncology today, vitals signs obtained and medications reviewed by CMA.

## 2021-01-21 NOTE — Consult Note (Signed)
NEW PATIENT EVALUATION  Name: Jennifer Sanders  MRN: [id number]  Date:   01/21/2021     DOB: 31-Aug-1956   This 65 y.o. female patient presents to the clinic for initial evaluation of stage Ia (T1c N0 M0) ER/PR positive HER-2 negative invasive mammary carcinoma of the.  Left breast status post wide local excision and sentinel node biopsy  REFERRING PHYSICIAN: Valerie Roys, DO  CHIEF COMPLAINT:  Chief Complaint  Patient presents with  . Consult    DIAGNOSIS: There were no encounter diagnoses.   PREVIOUS INVESTIGATIONS:  Pathology report reviewed Cyst mammogram and ultrasound reviewed Clinical notes reviewed  HPI: Patient is a 65 year old female who presented with an abnormal mammogram of her left breast.  There was fibroglandular density noted confirmed on tomogram to be a spiculated mass within the upper outer quadrant left breast measuring proxy 1 cm.  Targeted ultrasound was positive for invasive mammary carcinoma.  She underwent wide local excision sentinel node biopsy showing a 1.2 cm invasive mammary carcinoma grade 1.  Margins were clear at 7 mm.  2 sentinel lymph nodes were negative for metastatic disease.  Tumor was ER/PR positive.  Oncotype DX showed a low risk of recurrence with no benefit for systemic chemotherapy.  She is seen today for radiation oncology opinion she is doing well she specifically denies breast tenderness cough or bone pain.  She has been having some drainage from her axillary incision although it is been clear and not thought to be clinically significant by her surgeon. PLANNED TREATMENT REGIMEN:.  PAST MEDICAL HISTORY:  has a past medical history of Bilateral carpal tunnel syndrome, Diverticulosis, Hypertension, and Meralgia paraesthetica, left.    PAST SURGICAL HISTORY:  Past Surgical History:  Procedure Laterality Date  . BREAST BIOPSY Left 12/18/2020   [facility]  . BREAST LUMPECTOMY,RADIO FREQ LOCALIZER,AXILLARY SENTINEL LYMPH NODE BIOPSY Left 01/01/2021    Procedure: BREAST LUMPECTOMY,RADIO FREQ LOCALIZER,AXILLARY SENTINEL LYMPH NODE BIOPSY;  Surgeon: Ronny Bacon, MD;  Location: [facility] ORS;  Service: General;  Laterality: Left;  Left axilla  . CHOLECYSTECTOMY  1990s  . COLONOSCOPY  05/24/2016   internal hemorrhoids, diverticulosis    FAMILY HISTORY: family history includes Cancer in her father and mother; Hyperlipidemia in her mother; Throat cancer in her maternal grandmother.  SOCIAL HISTORY:  reports that she has never smoked. She has never used smokeless tobacco. She reports that she does not drink alcohol and does not use drugs.  ALLERGIES: Patient has no known allergies.  MEDICATIONS:  Current Outpatient Medications  Medication Sig Dispense Refill  . acetaminophen (TYLENOL) 500 MG tablet Take 500-1,000 mg by mouth every 6 (six) hours as needed (pain).    Marland Kitchen desonide (DESOWEN) 0.05 % lotion Apply 1 application topically 2 (two) times daily as needed (skin irritation.).    Marland Kitchen etodolac (LODINE) 500 MG tablet Take 1 tablet (500 mg total) by mouth 2 (two) times daily as needed. 60 tablet 3  . ibuprofen (ADVIL) 200 MG tablet Take 400-600 mg by mouth every 6 (six) hours as needed (pain.).    Marland Kitchen lisinopril-hydrochlorothiazide (ZESTORETIC) 20-12.5 MG tablet Take 1 tablet by mouth daily. 90 tablet 1  . pyridoxine (B-6) 200 MG tablet Take 200 mg by mouth daily.     No current facility-administered medications for this encounter.    ECOG PERFORMANCE STATUS:  0 - Asymptomatic  REVIEW OF SYSTEMS: Patient denies any weight loss, fatigue, weakness, fever, chills or night sweats. Patient denies any loss of vision, blurred vision. Patient denies  any ringing  of the ears or hearing loss. No irregular heartbeat. Patient denies heart murmur or history of fainting. Patient denies any chest pain or pain radiating to her upper extremities. Patient denies any shortness of breath, difficulty breathing at night, cough or hemoptysis. Patient denies any  swelling in the lower legs. Patient denies any nausea vomiting, vomiting of blood, or coffee ground material in the vomitus. Patient denies any stomach pain. Patient states has had normal bowel movements no significant constipation or diarrhea. Patient denies any dysuria, hematuria or significant nocturia. Patient denies any problems walking, swelling in the joints or loss of balance. Patient denies any skin changes, loss of hair or loss of weight. Patient denies any excessive worrying or anxiety or significant depression. Patient denies any problems with insomnia. Patient denies excessive thirst, polyuria, polydipsia. Patient denies any swollen glands, patient denies easy bruising or easy bleeding. Patient denies any recent infections, allergies or URI. Patient "s visual fields have not changed significantly in recent time.   PHYSICAL EXAM: BP (!) 145/92   Pulse 86   Temp (!) 97.4 F (36.3 C) (Tympanic)   Wt 194 lb (88 kg)   BMI 36.66 kg/m  She is status post wide local excision and sentinel node biopsy sites of her left breast which were both healing well.  No dominant masses noted in either breast.  No axillary or supraclavicular adenopathy is appreciated.  Well-developed well-nourished patient in NAD. HEENT reveals PERLA, EOMI, discs not visualized.  Oral cavity is clear. No oral mucosal lesions are identified. Neck is clear without evidence of cervical or supraclavicular adenopathy. Lungs are clear to A&P. Cardiac examination is essentially unremarkable with regular rate and rhythm without murmur rub or thrill. Abdomen is benign with no organomegaly or masses noted. Motor sensory and DTR levels are equal and symmetric in the upper and lower extremities. Cranial nerves II through XII are grossly intact. Proprioception is intact. No peripheral adenopathy or edema is identified. No motor or sensory levels are noted. Crude visual fields are within normal range.  LABORATORY DATA: Pathology report  reviewed    RADIOLOGY RESULTS: Mammograms and ultrasound reviewed compatible with above-stated findings   IMPRESSION: Stage Ia ER/PR positive base of mammary carcinoma left breast status post wide local excision and sentinel node biopsy in 65 year old female   PLAN: At this time I have recommended whole breast radiation therapy.  Patient's breasts are large and pendulous making hypofractionated course of treatment difficult.  I recommend 5040 cGy over 28 fractions boosting her scar another 1400 cGy using electron beam.  Risks and benefits of treatment including skin reaction fatigue alteration of blood counts possible inclusion of superficial lung all were discussed in detail with the patient.  She seems to comprehend my treatment plan well.  I personally set up and ordered CT simulation for next week.  She also benefit from antiestrogen therapy after completion of radiation.  I would like to take this opportunity to thank you for allowing me to participate in the care of your patient.Noreene Filbert, MD

## 2021-01-25 ENCOUNTER — Telehealth: Payer: Self-pay | Admitting: *Deleted

## 2021-01-25 NOTE — Telephone Encounter (Signed)
Jennifer Sanders from [facility] called stating that the prior authorization to [facility] for Oncotype DX has not been done and thay cannot proceed with testing until they is done by our office

## 2021-01-25 NOTE — Telephone Encounter (Signed)
Please speak with Tanya Nones because she was taking care of the PA last week and stated that they received it.

## 2021-01-26 NOTE — Telephone Encounter (Signed)
Jennifer Sanders - will you follow-up. I did not receive any additional paperwork to complete last week.

## 2021-01-26 NOTE — Telephone Encounter (Signed)
I called the company last week.  They received the fax we sent, but needed additional info  They were going to fax info to [facility] since I would not be there on Friday.  Let's see if Jennifer Sanders received the fax.  Jennifer Sanders gave me a copy of her results to give to Dr. Baruch Gouty.

## 2021-01-28 ENCOUNTER — Encounter: Payer: Self-pay | Admitting: Surgery

## 2021-01-28 ENCOUNTER — Ambulatory Visit
Admission: RE | Admit: 2021-01-28 | Discharge: 2021-01-28 | Disposition: A | Discharge status: Home / Self Care | Payer: 59 | Source: Ambulatory Visit | Attending: Radiation Oncology | Admitting: Radiation Oncology

## 2021-01-28 DIAGNOSIS — C50412 Malignant neoplasm of upper-outer quadrant of left female breast: Secondary | ICD-10-CM | POA: Insufficient documentation

## 2021-01-28 DIAGNOSIS — Z510 Encounter for antineoplastic radiation therapy: Secondary | ICD-10-CM | POA: Insufficient documentation

## 2021-01-29 ENCOUNTER — Other Ambulatory Visit: Payer: Self-pay | Admitting: *Deleted

## 2021-01-29 DIAGNOSIS — C50412 Malignant neoplasm of upper-outer quadrant of left female breast: Secondary | ICD-10-CM

## 2021-01-29 DIAGNOSIS — Z170 Estrogen receptor positive status [ER+]: Secondary | ICD-10-CM

## 2021-02-02 DIAGNOSIS — C50412 Malignant neoplasm of upper-outer quadrant of left female breast: Secondary | ICD-10-CM | POA: Diagnosis not present

## 2021-02-04 ENCOUNTER — Other Ambulatory Visit: Payer: Self-pay

## 2021-02-04 ENCOUNTER — Ambulatory Visit (INDEPENDENT_AMBULATORY_CARE_PROVIDER_SITE_OTHER): Payer: 59 | Admitting: Surgery

## 2021-02-04 ENCOUNTER — Ambulatory Visit: Admission: RE | Admit: 2021-02-04 | Payer: 59 | Source: Ambulatory Visit

## 2021-02-04 ENCOUNTER — Encounter: Payer: Self-pay | Admitting: Surgery

## 2021-02-04 ENCOUNTER — Ambulatory Visit: Payer: Self-pay

## 2021-02-04 VITALS — BP 134/65 | HR 84 | Temp 98.3°F | Ht 61.0 in | Wt 190.0 lb

## 2021-02-04 DIAGNOSIS — N611 Abscess of the breast and nipple: Secondary | ICD-10-CM | POA: Insufficient documentation

## 2021-02-04 DIAGNOSIS — C50412 Malignant neoplasm of upper-outer quadrant of left female breast: Secondary | ICD-10-CM

## 2021-02-04 MED ORDER — AMOXICILLIN-POT CLAVULANATE 875-125 MG PO TABS: 1.0000 | ORAL_TABLET | Freq: Two times a day (BID) | ORAL | 0 refills | Status: DC

## 2021-02-04 NOTE — Patient Instructions (Addendum)
We will call Interventional Radiology to have them place a drain in your breast to help to clear out the fluid. We will call you about this appointment. It will not be until Monday when they can get you in.   We will call in a prescription for antibiotics for you. We will send the fluid for culture to be sure we have you on the correct antibiotics.

## 2021-02-04 NOTE — Progress Notes (Signed)
[facility] POST-OP OFFICE VISIT  02/04/2021  HPI: Jennifer Sanders is a 65 y.o. female 34 days s/p left breast lumpectomy with sentinel lymph node biopsy.  Over the last few days she is noted a subtle increase increase in the fullness/pressure at her biopsy site the upper outer quadrant of the left breast.  The incision is slightly more hyperemic, she noted chills last night.  Vital signs: BP 134/65   Pulse 84   Temp 98.3 F (36.8 C)   Ht 5\' 1"  (1.549 m)   Wt 190 lb (86.2 kg)   SpO2 95%   BMI 35.90 kg/m    Physical Exam: Constitutional: She appears well, nontoxic. Left breast: The incision does appear more hyperemic, though intact.  There is a discolored bluish hue to the area of the lumpectomy seroma site, with slightly more fullness present.  She does have a small gap in the left axillary incision which does not appear to have any erythema, induration or increased calor.  The left breast area does have increased calor without erythema.  With her consent, evaluated the seroma site with the ultrasound, confirming some free fluid. The prep the area with Betadine, anesthetized the skin with 1% lidocaine, and aspirated 40 mL of purulent looking serous fluid.  Specimen sent for culture and sensitivity. Due to the extent of the fluid I do not believe I can fully aspirate it in its entirety, and feel placement of a drain would be the best way to manage this patient's postoperative abscess.  Assessment/Plan: This is a 65 y.o. female 34 days s/p left breast lumpectomy, now with postoperative abscess.  Patient Active Problem List   Diagnosis Date Noted  . Malignant neoplasm of upper-outer quadrant of left female breast ([place]) 12/22/2020  . Hypertension   . Bilateral carpal tunnel syndrome   . Meralgia paraesthetica, left   . Diverticulosis     -We will initiate Augmentin, anticipating later adjustment from the C&S,  this being likey skin pathogen, will follow up on  cultures and sensitivity and attempt to obtain IR assistance with placement of a drain. Other options include opening the incision in the office and placement of a Penrose drain.  This was discussed with the patient.  We are attempting to schedule drain placement.   Ronny Bacon M.D., FACS 02/04/2021, 3:20 PM

## 2021-02-05 ENCOUNTER — Telehealth: Payer: Self-pay

## 2021-02-05 ENCOUNTER — Other Ambulatory Visit: Payer: Self-pay | Admitting: Internal Medicine

## 2021-02-05 ENCOUNTER — Other Ambulatory Visit: Payer: Self-pay | Admitting: Surgery

## 2021-02-05 DIAGNOSIS — C50412 Malignant neoplasm of upper-outer quadrant of left female breast: Secondary | ICD-10-CM

## 2021-02-05 NOTE — Telephone Encounter (Signed)
Talked to [place] from special services and she stated that pt needs an [place] image guided fluid drain by catheter. I placed new order. Pt is scheduled for 02/09/21 @ 10:30 am, NPO after midnight, and has to have a driver.  Pt notified. Verbalizes understanding.

## 2021-02-07 ENCOUNTER — Encounter: Payer: Self-pay | Admitting: Surgery

## 2021-02-08 ENCOUNTER — Telehealth: Payer: Self-pay

## 2021-02-08 ENCOUNTER — Ambulatory Visit: Payer: 59

## 2021-02-08 ENCOUNTER — Other Ambulatory Visit: Payer: Self-pay

## 2021-02-08 ENCOUNTER — Other Ambulatory Visit: Payer: Self-pay | Admitting: Surgery

## 2021-02-08 ENCOUNTER — Ambulatory Visit (INDEPENDENT_AMBULATORY_CARE_PROVIDER_SITE_OTHER): Payer: 59

## 2021-02-08 VITALS — BP 145/95 | HR 91 | Temp 98.9°F

## 2021-02-08 DIAGNOSIS — N611 Abscess of the breast and nipple: Secondary | ICD-10-CM

## 2021-02-08 MED ORDER — SULFAMETHOXAZOLE-TRIMETHOPRIM 400-80 MG PO TABS: 2.0000 | ORAL_TABLET | Freq: Two times a day (BID) | ORAL | 0 refills | Status: DC

## 2021-02-08 NOTE — Progress Notes (Addendum)
Patient ID: Jennifer Sanders, female   DOB: 07-15-1956, 65 y.o.   MRN: [id number] Patient came in today for a wound check. She reports that she had copious amount of drainage starting on Friday about 120 cc and then had about another 60 cc on Saturday, she was still getting some small amount of drinage yesterday. The skin of the breast is no longer red or dusky colored. No heat is felt now. There is still a much smaller area of mild density felt in the left breast. She reports no fever or chills. She is still on antibiotics. I instructed to her to do warm compresses to the area several times a day. Per Dr Christian Mate she will follow up on Thursday. We have canceled her IR drain placement for tomorrow.

## 2021-02-08 NOTE — Progress Notes (Signed)
Culture and sensitivity from postop left breast seroma infection revealed MRSA. Prescription sent for double dose Septra DS for 14-day course.

## 2021-02-08 NOTE — Telephone Encounter (Signed)
Per Dr Christian Mate the patient's culture came back as positive for MRSA. He has sent her in a prescription for Bactrim to her pharmacy. I called to patient and left her a message to stop her Augmentin and to start the Bactrim. She will follow up as scheduled on Thursday. She is aware to call with any questions.

## 2021-02-08 NOTE — Addendum Note (Signed)
Addended by: Lesly Rubenstein on: 02/08/2021 09:23 AM   Modules accepted: Level of Service

## 2021-02-09 ENCOUNTER — Ambulatory Visit: Payer: 59

## 2021-02-09 LAB — ANAEROBIC AND AEROBIC CULTURE

## 2021-02-10 ENCOUNTER — Ambulatory Visit: Payer: 59

## 2021-02-11 ENCOUNTER — Other Ambulatory Visit: Payer: Self-pay

## 2021-02-11 ENCOUNTER — Ambulatory Visit: Payer: 59

## 2021-02-11 ENCOUNTER — Ambulatory Visit (INDEPENDENT_AMBULATORY_CARE_PROVIDER_SITE_OTHER): Payer: 59 | Admitting: Surgery

## 2021-02-11 ENCOUNTER — Encounter: Payer: Self-pay | Admitting: Surgery

## 2021-02-11 VITALS — BP 132/80 | HR 78 | Temp 99.2°F | Ht 61.0 in | Wt 188.6 lb

## 2021-02-11 DIAGNOSIS — N611 Abscess of the breast and nipple: Secondary | ICD-10-CM

## 2021-02-11 NOTE — Patient Instructions (Addendum)
Please call if you have any questions or concerns. Continue to take all Antibiotic until gone. Please see your appointment listed below.

## 2021-02-11 NOTE — Progress Notes (Signed)
Jennifer Sanders is here for follow-up after her spontaneously drained left breast lumpectomy seroma, infected with MRSA.  She appears to be tolerating her antibiotic transition to double strength Septra twice daily well.  There does not appear to be any recurrence of the seroma on exam, Levada Dy is present as chaperone.  All her incisions show no evidence of discharge, induration or erythema.  As for now we will continue her antibiotic course, have her return for follow-up in 1 week or as needed for reaccumulation of the seroma, erythema, fevers or chills or increased pain.  I anticipate repeating an in office ultrasound to evaluate for recurrence of the seroma at that time.  In the interim we will continue her Septra for her MRSA positive culture from the left breast seroma.  I believe she understands this and will call us to be reevaluated prior to her next appointed visit, should further symptoms arise.

## 2021-02-12 ENCOUNTER — Ambulatory Visit: Payer: 59

## 2021-02-15 ENCOUNTER — Ambulatory Visit: Payer: 59

## 2021-02-16 ENCOUNTER — Ambulatory Visit: Payer: 59

## 2021-02-17 ENCOUNTER — Ambulatory Visit: Payer: 59

## 2021-02-18 ENCOUNTER — Telehealth: Payer: Self-pay | Admitting: *Deleted

## 2021-02-18 ENCOUNTER — Other Ambulatory Visit: Payer: Self-pay

## 2021-02-18 ENCOUNTER — Encounter: Payer: Self-pay | Admitting: Surgery

## 2021-02-18 ENCOUNTER — Telehealth: Payer: Self-pay | Admitting: Surgery

## 2021-02-18 ENCOUNTER — Ambulatory Visit (INDEPENDENT_AMBULATORY_CARE_PROVIDER_SITE_OTHER): Payer: 59 | Admitting: Surgery

## 2021-02-18 ENCOUNTER — Ambulatory Visit: Payer: 59

## 2021-02-18 VITALS — BP 117/82 | HR 88 | Temp 98.8°F | Ht 61.0 in | Wt 188.8 lb

## 2021-02-18 DIAGNOSIS — N611 Abscess of the breast and nipple: Secondary | ICD-10-CM

## 2021-02-18 MED ORDER — SULFAMETHOXAZOLE-TRIMETHOPRIM 400-80 MG PO TABS: 2.0000 | ORAL_TABLET | Freq: Two times a day (BID) | ORAL | 0 refills | Status: AC

## 2021-02-18 NOTE — Telephone Encounter (Signed)
Patient calls back she got the information she needed from Dr. Donella Stade.  Disregard previous message.

## 2021-02-18 NOTE — Telephone Encounter (Signed)
Incoming call from patient.  Please call her, she needs more clarification.  States the Septra that was filled says to avoid the sun.  Patient wants to know if ok to still proceed doing her radiation therapy with oncology while she is taking the Septra or if she should wait until finishing the course of her antibiotics?

## 2021-02-18 NOTE — Patient Instructions (Addendum)
See your follow up appointment listed below. Please give [place] a call if you have any questions or concerns.

## 2021-02-18 NOTE — Progress Notes (Signed)
[facility] POST-OP OFFICE VISIT  02/18/2021  HPI: Jennifer Sanders is a 65 y.o. female 7 days s/p our last visit.  She is nearly completed her 2-week course of Septra for the MRSA positive culture from her left breast seroma.  She reports she is doing well, denying any pain or redness.  She denies any fevers or chills.  She denies any tenderness.  Vital signs: BP 117/82   Pulse 88   Temp 98.8 F (37.1 C) (Oral)   Ht 5\' 1"  (1.549 m)   Wt 188 lb 12.8 oz (85.6 kg)   SpO2 97%   BMI 35.67 kg/m    Physical Exam: Constitutional: She appears well. Left breast shows some indention of her scar.  No erythema, nontender.  Her left axillary incision has a small focus that still moist/unhealed completely. Skin: No evidence of erythema or inflammatory changes.  I utilized an ultrasound and confirmed the presence of a persistent seroma with a significant surrounding rind/inflammatory density.  It was nontender on ultrasound exam.  With sterile prep I placed an 18-gauge needle into the cavity and aspirated 10 mL of a clear serosanguineous fluid.  This was sent for culture and sensitivity.  Assessment/Plan: This is a 65 y.o. female 37 days s/p left breast lumpectomy with sentinel lymph node biopsy.  With MRSA infected left breast seroma.  She is now completed 2 weeks of Septra DS for MRSA.  Seems to be responding well with persistent seroma cavity.  Patient Active Problem List   Diagnosis Date Noted  . Breast abscess of female 02/04/2021  . Malignant neoplasm of upper-outer quadrant of left female breast ([place]) 12/22/2020  . Hypertension   . Bilateral carpal tunnel syndrome   . Meralgia paraesthetica, left   . Diverticulosis     -We will follow up the culture and sensitivity of today's aspiration and continue her on her course of Septra.  I am uncertain as to any change in planning for proceeding with her XRT.  It appears that her wound will never need to be opened, as it may be  reaspirated as necessary or percutaneously drained if necessary.  I will plan to see her back in a week, attempting to coordinate around her initiation of radiation or further evaluation by radiation oncology. I will refill her Septra.   Ronny Bacon M.D., FACS 02/18/2021, 11:02 AM

## 2021-02-18 NOTE — Telephone Encounter (Signed)
Patient called reporting that she has MRSA in her breast surgical site and that she is still on medicine for it which warns that sunburn could be worse whilst on this medicine. She said that her surgeon told her that she could go ahead and start radiation therapy if Dr Baruch Gouty is ok with this, but she has reservations about starting. Please contact patient to discuss

## 2021-02-19 ENCOUNTER — Ambulatory Visit: Payer: 59

## 2021-02-22 ENCOUNTER — Ambulatory Visit
Admission: RE | Admit: 2021-02-22 | Discharge: 2021-02-22 | Disposition: A | Discharge status: Home / Self Care | Payer: 59 | Source: Ambulatory Visit | Attending: Radiation Oncology | Admitting: Radiation Oncology

## 2021-02-22 DIAGNOSIS — C50412 Malignant neoplasm of upper-outer quadrant of left female breast: Secondary | ICD-10-CM | POA: Diagnosis not present

## 2021-02-23 ENCOUNTER — Ambulatory Visit
Admission: RE | Admit: 2021-02-23 | Discharge: 2021-02-23 | Disposition: A | Discharge status: Home / Self Care | Payer: 59 | Source: Ambulatory Visit | Attending: Radiation Oncology | Admitting: Radiation Oncology

## 2021-02-23 DIAGNOSIS — C50412 Malignant neoplasm of upper-outer quadrant of left female breast: Secondary | ICD-10-CM | POA: Diagnosis not present

## 2021-02-24 ENCOUNTER — Ambulatory Visit
Admission: RE | Admit: 2021-02-24 | Discharge: 2021-02-24 | Disposition: A | Discharge status: Home / Self Care | Payer: 59 | Source: Ambulatory Visit | Attending: Radiation Oncology | Admitting: Radiation Oncology

## 2021-02-24 ENCOUNTER — Inpatient Hospital Stay: Payer: 59

## 2021-02-24 DIAGNOSIS — C50412 Malignant neoplasm of upper-outer quadrant of left female breast: Secondary | ICD-10-CM | POA: Diagnosis not present

## 2021-02-25 ENCOUNTER — Ambulatory Visit (INDEPENDENT_AMBULATORY_CARE_PROVIDER_SITE_OTHER): Payer: 59 | Admitting: Surgery

## 2021-02-25 ENCOUNTER — Encounter: Payer: Self-pay | Admitting: Surgery

## 2021-02-25 ENCOUNTER — Other Ambulatory Visit: Payer: Self-pay

## 2021-02-25 ENCOUNTER — Ambulatory Visit
Admission: RE | Admit: 2021-02-25 | Discharge: 2021-02-25 | Disposition: A | Discharge status: Home / Self Care | Payer: 59 | Source: Ambulatory Visit | Attending: Radiation Oncology | Admitting: Radiation Oncology

## 2021-02-25 VITALS — BP 114/79 | HR 89 | Temp 98.9°F | Ht 61.0 in | Wt 190.0 lb

## 2021-02-25 DIAGNOSIS — N611 Abscess of the breast and nipple: Secondary | ICD-10-CM

## 2021-02-25 DIAGNOSIS — C50412 Malignant neoplasm of upper-outer quadrant of left female breast: Secondary | ICD-10-CM | POA: Diagnosis not present

## 2021-02-25 NOTE — Progress Notes (Signed)
[facility] POST-OP OFFICE VISIT  02/25/2021  HPI: Jennifer Sanders is a 65 y.o. female 45 days s/p left breast conservation.  Her last aspiration culture did not reconfirm MRSA, of course she was on antibiotics at the time for the previously positive culture.  She continues to take her Septra DS.  She just started radiation now, and notes the early initial changes.   Vital signs: BP 114/79   Pulse 89   Temp 98.9 F (37.2 C)   Ht 5\' 1"  (1.549 m)   Wt 190 lb (86.2 kg)   SpO2 96%   BMI 35.90 kg/m    Physical Exam: Constitutional: She appears well, nontoxic. The scar is slightly indented, there is no significant induration, or tenderness around the area.  There is no hyperemia.  I cannot appreciate any significant seroma present on exam.     Assessment/Plan: This is a 65 y.o. female 59 days s/p breast conservation treatment for left breast cancer.  Infected seroma 1 month postop with MRSA.  She is now begun radiation therapy.  Patient Active Problem List   Diagnosis Date Noted  . Breast abscess of female 02/04/2021  . Malignant neoplasm of upper-outer quadrant of left female breast ([place]) 12/22/2020  . Hypertension   . Bilateral carpal tunnel syndrome   . Meralgia paraesthetica, left   . Diverticulosis     -Advised he she complete the second course of Septra DS.  I do not see evidence that she is has any active infection at the present time, but she remains at risk.  We will continue continue to have her complete her course of radiation, will be glad to see her as needed should any fever, increased pain, induration or erythema develop.  I believe she understands and will follow through with [place].   Ronny Bacon M.D., FACS 02/25/2021, 11:52 AM

## 2021-02-25 NOTE — Patient Instructions (Addendum)
Continue taking your antibiotics and finish them.   Call us if you start to build up a lot of fluid and we will have you come in.   Follow up in one month.

## 2021-02-26 ENCOUNTER — Ambulatory Visit
Admission: RE | Admit: 2021-02-26 | Discharge: 2021-02-26 | Disposition: A | Discharge status: Home / Self Care | Payer: 59 | Source: Ambulatory Visit | Attending: Radiation Oncology | Admitting: Radiation Oncology

## 2021-02-26 DIAGNOSIS — C510 Malignant neoplasm of labium majus: Secondary | ICD-10-CM | POA: Diagnosis present

## 2021-02-26 DIAGNOSIS — C50412 Malignant neoplasm of upper-outer quadrant of left female breast: Secondary | ICD-10-CM | POA: Insufficient documentation

## 2021-02-26 DIAGNOSIS — Z510 Encounter for antineoplastic radiation therapy: Secondary | ICD-10-CM | POA: Diagnosis not present

## 2021-02-27 LAB — ANAEROBIC AND AEROBIC CULTURE

## 2021-03-01 ENCOUNTER — Ambulatory Visit
Admission: RE | Admit: 2021-03-01 | Discharge: 2021-03-01 | Disposition: A | Discharge status: Home / Self Care | Payer: 59 | Source: Ambulatory Visit | Attending: Radiation Oncology | Admitting: Radiation Oncology

## 2021-03-01 DIAGNOSIS — Z510 Encounter for antineoplastic radiation therapy: Secondary | ICD-10-CM | POA: Diagnosis not present

## 2021-03-02 ENCOUNTER — Ambulatory Visit
Admission: RE | Admit: 2021-03-02 | Discharge: 2021-03-02 | Disposition: A | Discharge status: Home / Self Care | Payer: 59 | Source: Ambulatory Visit | Attending: Radiation Oncology | Admitting: Radiation Oncology

## 2021-03-02 DIAGNOSIS — Z510 Encounter for antineoplastic radiation therapy: Secondary | ICD-10-CM | POA: Diagnosis not present

## 2021-03-03 ENCOUNTER — Ambulatory Visit
Admission: RE | Admit: 2021-03-03 | Discharge: 2021-03-03 | Disposition: A | Discharge status: Home / Self Care | Payer: 59 | Source: Ambulatory Visit | Attending: Radiation Oncology | Admitting: Radiation Oncology

## 2021-03-03 DIAGNOSIS — Z510 Encounter for antineoplastic radiation therapy: Secondary | ICD-10-CM | POA: Diagnosis not present

## 2021-03-04 ENCOUNTER — Ambulatory Visit
Admission: RE | Admit: 2021-03-04 | Discharge: 2021-03-04 | Disposition: A | Discharge status: Home / Self Care | Payer: 59 | Source: Ambulatory Visit | Attending: Radiation Oncology | Admitting: Radiation Oncology

## 2021-03-04 DIAGNOSIS — Z510 Encounter for antineoplastic radiation therapy: Secondary | ICD-10-CM | POA: Diagnosis not present

## 2021-03-05 ENCOUNTER — Ambulatory Visit
Admission: RE | Admit: 2021-03-05 | Discharge: 2021-03-05 | Disposition: A | Discharge status: Home / Self Care | Payer: 59 | Source: Ambulatory Visit | Attending: Radiation Oncology | Admitting: Radiation Oncology

## 2021-03-05 DIAGNOSIS — Z510 Encounter for antineoplastic radiation therapy: Secondary | ICD-10-CM | POA: Diagnosis not present

## 2021-03-08 ENCOUNTER — Ambulatory Visit
Admission: RE | Admit: 2021-03-08 | Discharge: 2021-03-08 | Disposition: A | Discharge status: Home / Self Care | Payer: 59 | Source: Ambulatory Visit | Attending: Radiation Oncology | Admitting: Radiation Oncology

## 2021-03-08 DIAGNOSIS — Z510 Encounter for antineoplastic radiation therapy: Secondary | ICD-10-CM | POA: Diagnosis not present

## 2021-03-09 ENCOUNTER — Ambulatory Visit
Admission: RE | Admit: 2021-03-09 | Discharge: 2021-03-09 | Disposition: A | Discharge status: Home / Self Care | Payer: 59 | Source: Ambulatory Visit | Attending: Radiation Oncology | Admitting: Radiation Oncology

## 2021-03-09 DIAGNOSIS — Z510 Encounter for antineoplastic radiation therapy: Secondary | ICD-10-CM | POA: Diagnosis not present

## 2021-03-10 ENCOUNTER — Inpatient Hospital Stay: Payer: 59 | Attending: Oncology

## 2021-03-10 ENCOUNTER — Ambulatory Visit
Admission: RE | Admit: 2021-03-10 | Discharge: 2021-03-10 | Disposition: A | Discharge status: Home / Self Care | Payer: 59 | Source: Ambulatory Visit | Attending: Radiation Oncology | Admitting: Radiation Oncology

## 2021-03-10 DIAGNOSIS — Z510 Encounter for antineoplastic radiation therapy: Secondary | ICD-10-CM | POA: Insufficient documentation

## 2021-03-10 DIAGNOSIS — Z170 Estrogen receptor positive status [ER+]: Secondary | ICD-10-CM

## 2021-03-10 DIAGNOSIS — C50412 Malignant neoplasm of upper-outer quadrant of left female breast: Secondary | ICD-10-CM | POA: Insufficient documentation

## 2021-03-10 LAB — CBC
HCT: 40.4 % (ref 36.0–46.0)
Hemoglobin: 13.2 g/dL (ref 12.0–15.0)
MCH: 27.3 pg (ref 26.0–34.0)
MCHC: 32.7 g/dL (ref 30.0–36.0)
MCV: 83.5 fL (ref 80.0–100.0)
Platelets: 218 10*3/uL (ref 150–400)
RBC: 4.84 MIL/uL (ref 3.87–5.11)
RDW: 13.6 % (ref 11.5–15.5)
WBC: 5.6 10*3/uL (ref 4.0–10.5)
nRBC: 0 % (ref 0.0–0.2)

## 2021-03-11 ENCOUNTER — Ambulatory Visit
Admission: RE | Admit: 2021-03-11 | Discharge: 2021-03-11 | Disposition: A | Discharge status: Home / Self Care | Payer: 59 | Source: Ambulatory Visit | Attending: Radiation Oncology | Admitting: Radiation Oncology

## 2021-03-11 DIAGNOSIS — Z510 Encounter for antineoplastic radiation therapy: Secondary | ICD-10-CM | POA: Diagnosis not present

## 2021-03-12 ENCOUNTER — Ambulatory Visit
Admission: RE | Admit: 2021-03-12 | Discharge: 2021-03-12 | Disposition: A | Discharge status: Home / Self Care | Payer: 59 | Source: Ambulatory Visit | Attending: Radiation Oncology | Admitting: Radiation Oncology

## 2021-03-12 DIAGNOSIS — Z510 Encounter for antineoplastic radiation therapy: Secondary | ICD-10-CM | POA: Diagnosis not present

## 2021-03-15 ENCOUNTER — Ambulatory Visit
Admission: RE | Admit: 2021-03-15 | Discharge: 2021-03-15 | Disposition: A | Discharge status: Home / Self Care | Payer: 59 | Source: Ambulatory Visit | Attending: Radiation Oncology | Admitting: Radiation Oncology

## 2021-03-15 DIAGNOSIS — Z510 Encounter for antineoplastic radiation therapy: Secondary | ICD-10-CM | POA: Diagnosis not present

## 2021-03-16 ENCOUNTER — Ambulatory Visit
Admission: RE | Admit: 2021-03-16 | Discharge: 2021-03-16 | Disposition: A | Discharge status: Home / Self Care | Payer: 59 | Source: Ambulatory Visit | Attending: Radiation Oncology | Admitting: Radiation Oncology

## 2021-03-16 DIAGNOSIS — Z510 Encounter for antineoplastic radiation therapy: Secondary | ICD-10-CM | POA: Diagnosis not present

## 2021-03-17 ENCOUNTER — Ambulatory Visit
Admission: RE | Admit: 2021-03-17 | Discharge: 2021-03-17 | Disposition: A | Discharge status: Home / Self Care | Payer: 59 | Source: Ambulatory Visit | Attending: Radiation Oncology | Admitting: Radiation Oncology

## 2021-03-17 ENCOUNTER — Telehealth: Payer: Self-pay | Admitting: *Deleted

## 2021-03-17 DIAGNOSIS — Z510 Encounter for antineoplastic radiation therapy: Secondary | ICD-10-CM | POA: Diagnosis not present

## 2021-03-17 NOTE — Telephone Encounter (Signed)
Got fax about why pt needed oncotype for insurance purposes. Fax paper back with NCCN guidelines that pt. Met to see is she would benefit from chemo or not. Faxed [phone]

## 2021-03-18 ENCOUNTER — Ambulatory Visit (INDEPENDENT_AMBULATORY_CARE_PROVIDER_SITE_OTHER): Payer: 59 | Admitting: Family Medicine

## 2021-03-18 ENCOUNTER — Encounter: Payer: Self-pay | Admitting: Family Medicine

## 2021-03-18 ENCOUNTER — Ambulatory Visit
Admission: RE | Admit: 2021-03-18 | Discharge: 2021-03-18 | Disposition: A | Discharge status: Home / Self Care | Payer: 59 | Source: Ambulatory Visit | Attending: Radiation Oncology | Admitting: Radiation Oncology

## 2021-03-18 ENCOUNTER — Other Ambulatory Visit: Payer: Self-pay

## 2021-03-18 ENCOUNTER — Ambulatory Visit: Payer: 59

## 2021-03-18 VITALS — BP 101/65 | HR 73 | Temp 98.2°F | Ht 61.0 in | Wt 195.0 lb

## 2021-03-18 DIAGNOSIS — Z0000 Encounter for general adult medical examination without abnormal findings: Secondary | ICD-10-CM | POA: Diagnosis not present

## 2021-03-18 DIAGNOSIS — C50412 Malignant neoplasm of upper-outer quadrant of left female breast: Secondary | ICD-10-CM

## 2021-03-18 DIAGNOSIS — I10 Essential (primary) hypertension: Secondary | ICD-10-CM

## 2021-03-18 DIAGNOSIS — Z510 Encounter for antineoplastic radiation therapy: Secondary | ICD-10-CM | POA: Diagnosis not present

## 2021-03-18 LAB — MICROALBUMIN, URINE WAIVED
Creatinine, Urine Waived: 200 mg/dL (ref 10–300)
Microalb, Ur Waived: 10 mg/L (ref 0–19)
Microalb/Creat Ratio: <30 mg/g (ref <30)

## 2021-03-18 LAB — URINALYSIS, ROUTINE W REFLEX MICROSCOPIC
Bilirubin, UA: NEGATIVE
Glucose, UA: NEGATIVE
Ketones, UA: NEGATIVE
Leukocytes,UA: NEGATIVE
Nitrite, UA: NEGATIVE
Protein,UA: NEGATIVE
RBC, UA: NEGATIVE
Specific Gravity, UA: 1.015 (ref 1.005–1.030)
Urobilinogen, Ur: 0.2 mg/dL (ref 0.2–1.0)
pH, UA: 5.5 (ref 5.0–7.5)

## 2021-03-18 MED ORDER — LISINOPRIL-HYDROCHLOROTHIAZIDE 20-12.5 MG PO TABS: 1.0000 | ORAL_TABLET | Freq: Every day | ORAL | 1 refills | Status: DC

## 2021-03-18 MED ORDER — ETODOLAC 500 MG PO TABS: 500.0000 mg | ORAL_TABLET | Freq: Two times a day (BID) | ORAL | 3 refills | Status: DC | PRN

## 2021-03-18 NOTE — Assessment & Plan Note (Signed)
Under good control on current regimen. Continue current regimen. Continue to monitor. Call with any concerns. Refills given. Labs drawn today.   

## 2021-03-18 NOTE — Progress Notes (Signed)
BP 101/65   Pulse 73   Temp 98.2 F (36.8 C)   Ht 5\' 1"  (1.549 m)   Wt 195 lb (88.5 kg)   SpO2 99%   BMI 36.84 kg/m    Subjective:    Patient ID: Jennifer Sanders, female    DOB: 01/13/1956, 65 y.o.   MRN: [id number]  HPI: Jennifer Sanders is a 65 y.o. female presenting on 03/18/2021 for comprehensive medical examination. Current medical complaints include:  HYPERTENSION Hypertension status: controlled  Satisfied with current treatment? yes Duration of hypertension: chronic BP monitoring frequency:  not checking BP medication side effects:  no Medication compliance: excellent compliance Previous BP meds: lisinopril and hctz Aspirin: no Recurrent headaches: no Visual changes: no Palpitations: no Dyspnea: no Chest pain: no Lower extremity edema: no Dizzy/lightheaded: no  Depression Screen done today and results listed below:  Depression screen [facility] 2/9 03/18/2021 09/16/2020 01/25/2019 07/23/2018 07/17/2017  Decreased Interest 0 0 0 0 0  Down, Depressed, Hopeless 0 1 0 0 1  PHQ - 2 Score 0 1 0 0 1  Altered sleeping - 0 0 0 0  Tired, decreased energy - 0 1 1 1   Change in appetite - 0 0 0 0  Feeling bad or failure about yourself  - 0 0 0 0  Trouble concentrating - 0 0 0 0  Moving slowly or fidgety/restless - 0 0 0 0  Suicidal thoughts - 0 0 0 0  PHQ-9 Score - 1 1 1 2   Difficult doing work/chores - Not difficult at all Not difficult at all Not difficult at all Somewhat difficult    Past Medical History:  Past Medical History:  Diagnosis Date  . Bilateral carpal tunnel syndrome   . Diverticulosis    found on colonoscopy on 05/24/16  . Hypertension   . Meralgia paraesthetica, left     Surgical History:  Past Surgical History:  Procedure Laterality Date  . BREAST BIOPSY Left 12/18/2020   [facility]  . BREAST LUMPECTOMY,RADIO FREQ LOCALIZER,AXILLARY SENTINEL LYMPH NODE BIOPSY Left 01/01/2021   Procedure: BREAST LUMPECTOMY,RADIO FREQ LOCALIZER,AXILLARY SENTINEL LYMPH NODE  BIOPSY;  Surgeon: Ronny Bacon, MD;  Location: [facility] ORS;  Service: General;  Laterality: Left;  Left axilla  . CHOLECYSTECTOMY  1990s  . COLONOSCOPY  05/24/2016   internal hemorrhoids, diverticulosis    Medications:  Current Outpatient Medications on File Prior to Visit  Medication Sig  . acetaminophen (TYLENOL) 500 MG tablet Take 500-1,000 mg by mouth every 6 (six) hours as needed (pain).  Marland Kitchen desonide (DESOWEN) 0.05 % lotion Apply 1 application topically 2 (two) times daily as needed (skin irritation.).  Marland Kitchen ibuprofen (ADVIL) 200 MG tablet Take 400-600 mg by mouth every 6 (six) hours as needed (pain.).  Marland Kitchen pyridoxine (B-6) 200 MG tablet Take 200 mg by mouth daily.   No current facility-administered medications on file prior to visit.    Allergies:  No Known Allergies  Social History:  Social History   Socioeconomic History  . Marital status: Married    Spouse name: Not on file  . Number of children: Not on file  . Years of education: Not on file  . Highest education level: Not on file  Occupational History  . Not on file  Tobacco Use  . Smoking status: Never Smoker  . Smokeless tobacco: Never Used  Vaping Use  . Vaping Use: Never used  Substance and Sexual Activity  . Alcohol use: No  . Drug use: No  . Sexual  activity: Yes  Other Topics Concern  . Not on file  Social History Narrative  . Not on file   Social Determinants of Health   Financial Resource Strain: Not on file  Food Insecurity: Not on file  Transportation Needs: Not on file  Physical Activity: Not on file  Stress: Not on file  Social Connections: Not on file  Intimate Partner Violence: Not on file   Social History   Tobacco Use  Smoking Status Never Smoker  Smokeless Tobacco Never Used   Social History   Substance and Sexual Activity  Alcohol Use No    Family History:  Family History  Problem Relation Age of Onset  . Hyperlipidemia Mother   . Cancer Mother        lung  . Cancer  Father        esophageal  . Throat cancer Maternal Grandmother   . Breast cancer Neg Hx     Past medical history, surgical history, medications, allergies, family history and social history reviewed with patient today and changes made to appropriate areas of the chart.   Review of Systems  Constitutional: Negative.   HENT: Negative.        Wax build up  Eyes: Negative.   Respiratory: Negative.   Cardiovascular: Negative.   Gastrointestinal: Negative.   Genitourinary: Negative.   Musculoskeletal: Negative.   Skin: Negative.   Neurological: Negative.   Endo/Heme/Allergies: Negative.   Psychiatric/Behavioral: Negative.    All other ROS negative except what is listed above and in the HPI.      Objective:    BP 101/65   Pulse 73   Temp 98.2 F (36.8 C)   Ht 5\' 1"  (1.549 m)   Wt 195 lb (88.5 kg)   SpO2 99%   BMI 36.84 kg/m   Wt Readings from Last 3 Encounters:  03/18/21 195 lb (88.5 kg)  02/25/21 190 lb (86.2 kg)  02/18/21 188 lb 12.8 oz (85.6 kg)    Physical Exam  Results for orders placed or performed in visit on 03/10/21  CBC  Result Value Ref Range   WBC 5.6 4.0 - 10.5 K/uL   RBC 4.84 3.87 - 5.11 MIL/uL   Hemoglobin 13.2 12.0 - 15.0 g/dL   HCT 40.4 36.0 - 46.0 %   MCV 83.5 80.0 - 100.0 fL   MCH 27.3 26.0 - 34.0 pg   MCHC 32.7 30.0 - 36.0 g/dL   RDW 13.6 11.5 - 15.5 %   Platelets 218 150 - 400 K/uL   nRBC 0.0 0.0 - 0.2 %      Assessment & Plan:   Problem List Items Addressed This Visit      Cardiovascular and Mediastinum   Hypertension    Under good control on current regimen. Continue current regimen. Continue to monitor. Call with any concerns. Refills given. Labs drawn today.        Relevant Medications   lisinopril-hydrochlorothiazide (ZESTORETIC) 20-12.5 MG tablet   Other Relevant Orders   CBC with Differential/Platelet   Comprehensive metabolic panel   TSH   Microalbumin, Urine Waived     Other   Malignant neoplasm of upper-outer  quadrant of left female breast [facility])    Following with oncology and radiation oncology. Doing well. Continue to monitor. Call with any concerns.        Other Visit Diagnoses    Routine general medical examination at a health care facility    -  Primary   Vaccines up to  date. Screening labs checked today. Pap, colonscopy and mammo up to date. Continue diet and exercise. Call with any concerns.    Relevant Orders   CBC with Differential/Platelet   Comprehensive metabolic panel   Lipid Panel w/o Chol/HDL Ratio   Urinalysis, Routine w reflex microscopic   TSH       Follow up plan: Return in about 6 months (around 09/17/2021).   LABORATORY TESTING:  - Pap smear: up to date  IMMUNIZATIONS:   - Tdap: Tetanus vaccination status reviewed: last tetanus booster within 10 years. - Influenza: Up to date - Pneumovax: Not applicable - COVID: Up to date  SCREENING: -Mammogram: Up to date  - Colonoscopy: Up to date   PATIENT COUNSELING:   Advised to take 1 mg of folate supplement per day if capable of pregnancy.   Sexuality: Discussed sexually transmitted diseases, partner selection, use of condoms, avoidance of unintended pregnancy  and contraceptive alternatives.   Advised to avoid cigarette smoking.  I discussed with the patient that most people either abstain from alcohol or drink within safe limits (<=14/week and <=4 drinks/occasion for males, <=7/weeks and <= 3 drinks/occasion for females) and that the risk for alcohol disorders and other health effects rises proportionally with the number of drinks per week and how often a drinker exceeds daily limits.  Discussed cessation/primary prevention of drug use and availability of treatment for abuse.   Diet: Encouraged to adjust caloric intake to maintain  or achieve ideal body weight, to reduce intake of dietary saturated fat and total fat, to limit sodium intake by avoiding high sodium foods and not adding table salt, and to maintain  adequate dietary potassium and calcium preferably from fresh fruits, vegetables, and low-fat dairy products.    stressed the importance of regular exercise  Injury prevention: Discussed safety belts, safety helmets, smoke detector, smoking near bedding or upholstery.   Dental health: Discussed importance of regular tooth brushing, flossing, and dental visits.    NEXT PREVENTATIVE PHYSICAL DUE IN 1 YEAR. Return in about 6 months (around 09/17/2021).

## 2021-03-18 NOTE — Patient Instructions (Signed)

## 2021-03-18 NOTE — Assessment & Plan Note (Signed)
Following with oncology and radiation oncology. Doing well. Continue to monitor. Call with any concerns.

## 2021-03-19 ENCOUNTER — Ambulatory Visit: Payer: 59

## 2021-03-19 ENCOUNTER — Ambulatory Visit
Admission: RE | Admit: 2021-03-19 | Discharge: 2021-03-19 | Disposition: A | Discharge status: Home / Self Care | Payer: 59 | Source: Ambulatory Visit | Attending: Radiation Oncology | Admitting: Radiation Oncology

## 2021-03-19 DIAGNOSIS — Z510 Encounter for antineoplastic radiation therapy: Secondary | ICD-10-CM | POA: Diagnosis not present

## 2021-03-19 LAB — CBC WITH DIFFERENTIAL/PLATELET
Basophils Absolute: 0 10*3/uL (ref 0.0–0.2)
Basos: 1 %
EOS (ABSOLUTE): 0.1 10*3/uL (ref 0.0–0.4)
Eos: 1 %
Hematocrit: 41.6 % (ref 34.0–46.6)
Hemoglobin: 13.8 g/dL (ref 11.1–15.9)
Immature Grans (Abs): 0 10*3/uL (ref 0.0–0.1)
Immature Granulocytes: 0 %
Lymphocytes Absolute: 0.9 10*3/uL (ref 0.7–3.1)
Lymphs: 13 %
MCH: 28.1 pg (ref 26.6–33.0)
MCHC: 33.2 g/dL (ref 31.5–35.7)
MCV: 85 fL (ref 79–97)
Monocytes Absolute: 0.6 10*3/uL (ref 0.1–0.9)
Monocytes: 9 %
Neutrophils Absolute: 5 10*3/uL (ref 1.4–7.0)
Neutrophils: 76 %
Platelets: 231 10*3/uL (ref 150–450)
RBC: 4.91 x10E6/uL (ref 3.77–5.28)
RDW: 13.9 % (ref 11.7–15.4)
WBC: 6.5 10*3/uL (ref 3.4–10.8)

## 2021-03-19 LAB — COMPREHENSIVE METABOLIC PANEL
ALT: 16 IU/L (ref 0–32)
AST: 21 IU/L (ref 0–40)
Albumin/Globulin Ratio: 1.4 (ref 1.2–2.2)
Albumin: 4.2 g/dL (ref 3.8–4.8)
Alkaline Phosphatase: 75 IU/L (ref 44–121)
BUN/Creatinine Ratio: 8 — ABNORMAL LOW (ref 12–28)
BUN: 7 mg/dL — ABNORMAL LOW (ref 8–27)
Bilirubin Total: 0.5 mg/dL (ref 0.0–1.2)
CO2: 24 mmol/L (ref 20–29)
Calcium: 9.7 mg/dL (ref 8.7–10.3)
Chloride: 101 mmol/L (ref 96–106)
Creatinine, Ser: 0.86 mg/dL (ref 0.57–1.00)
Globulin, Total: 3 g/dL (ref 1.5–4.5)
Glucose: 106 mg/dL — ABNORMAL HIGH (ref 65–99)
Potassium: 4.6 mmol/L (ref 3.5–5.2)
Sodium: 140 mmol/L (ref 134–144)
Total Protein: 7.2 g/dL (ref 6.0–8.5)
eGFR: 75 mL/min/{1.73_m2} (ref >59)

## 2021-03-19 LAB — TSH: TSH: 2.71 u[IU]/mL (ref 0.450–4.500)

## 2021-03-19 LAB — LIPID PANEL W/O CHOL/HDL RATIO
Cholesterol, Total: 196 mg/dL (ref 100–199)
HDL: 64 mg/dL (ref >39)
LDL Chol Calc (NIH): 113 mg/dL — ABNORMAL HIGH (ref 0–99)
Triglycerides: 106 mg/dL (ref 0–149)
VLDL Cholesterol Cal: 19 mg/dL (ref 5–40)

## 2021-03-22 ENCOUNTER — Ambulatory Visit: Payer: 59

## 2021-03-22 ENCOUNTER — Inpatient Hospital Stay: Payer: 59

## 2021-03-22 ENCOUNTER — Ambulatory Visit
Admission: RE | Admit: 2021-03-22 | Discharge: 2021-03-22 | Disposition: A | Discharge status: Home / Self Care | Payer: 59 | Source: Ambulatory Visit | Attending: Radiation Oncology | Admitting: Radiation Oncology

## 2021-03-22 DIAGNOSIS — Z510 Encounter for antineoplastic radiation therapy: Secondary | ICD-10-CM | POA: Diagnosis not present

## 2021-03-23 ENCOUNTER — Ambulatory Visit: Payer: 59

## 2021-03-23 ENCOUNTER — Ambulatory Visit
Admission: RE | Admit: 2021-03-23 | Discharge: 2021-03-23 | Disposition: A | Discharge status: Home / Self Care | Payer: 59 | Source: Ambulatory Visit | Attending: Radiation Oncology | Admitting: Radiation Oncology

## 2021-03-23 ENCOUNTER — Inpatient Hospital Stay: Payer: 59

## 2021-03-23 DIAGNOSIS — Z170 Estrogen receptor positive status [ER+]: Secondary | ICD-10-CM

## 2021-03-23 DIAGNOSIS — Z510 Encounter for antineoplastic radiation therapy: Secondary | ICD-10-CM | POA: Diagnosis not present

## 2021-03-23 DIAGNOSIS — C50412 Malignant neoplasm of upper-outer quadrant of left female breast: Secondary | ICD-10-CM

## 2021-03-23 LAB — CBC
HCT: 41.4 % (ref 36.0–46.0)
Hemoglobin: 13.8 g/dL (ref 12.0–15.0)
MCH: 27.8 pg (ref 26.0–34.0)
MCHC: 33.3 g/dL (ref 30.0–36.0)
MCV: 83.3 fL (ref 80.0–100.0)
Platelets: 213 10*3/uL (ref 150–400)
RBC: 4.97 MIL/uL (ref 3.87–5.11)
RDW: 13.6 % (ref 11.5–15.5)
WBC: 7 10*3/uL (ref 4.0–10.5)
nRBC: 0 % (ref 0.0–0.2)

## 2021-03-24 ENCOUNTER — Ambulatory Visit
Admission: RE | Admit: 2021-03-24 | Discharge: 2021-03-24 | Disposition: A | Discharge status: Home / Self Care | Payer: 59 | Source: Ambulatory Visit | Attending: Radiation Oncology | Admitting: Radiation Oncology

## 2021-03-24 ENCOUNTER — Ambulatory Visit: Payer: 59

## 2021-03-24 DIAGNOSIS — Z510 Encounter for antineoplastic radiation therapy: Secondary | ICD-10-CM | POA: Diagnosis not present

## 2021-03-25 ENCOUNTER — Ambulatory Visit: Payer: 59

## 2021-03-25 ENCOUNTER — Encounter: Payer: 59 | Admitting: Surgery

## 2021-03-25 ENCOUNTER — Ambulatory Visit
Admission: RE | Admit: 2021-03-25 | Discharge: 2021-03-25 | Disposition: A | Discharge status: Home / Self Care | Payer: 59 | Source: Ambulatory Visit | Attending: Radiation Oncology | Admitting: Radiation Oncology

## 2021-03-25 DIAGNOSIS — Z510 Encounter for antineoplastic radiation therapy: Secondary | ICD-10-CM | POA: Diagnosis not present

## 2021-03-26 ENCOUNTER — Ambulatory Visit: Payer: 59

## 2021-03-26 ENCOUNTER — Other Ambulatory Visit: Payer: Self-pay | Admitting: *Deleted

## 2021-03-26 ENCOUNTER — Ambulatory Visit
Admission: RE | Admit: 2021-03-26 | Discharge: 2021-03-26 | Disposition: A | Discharge status: Home / Self Care | Payer: 59 | Source: Ambulatory Visit | Attending: Radiation Oncology | Admitting: Radiation Oncology

## 2021-03-26 DIAGNOSIS — Z510 Encounter for antineoplastic radiation therapy: Secondary | ICD-10-CM | POA: Diagnosis not present

## 2021-03-26 MED ORDER — SILVER SULFADIAZINE 1 % EX CREA
1.0000 | TOPICAL_CREAM | Freq: Two times a day (BID) | CUTANEOUS | 2 refills | Status: DC
Start: 2021-03-26 — End: 2021-04-13

## 2021-03-27 NOTE — Progress Notes (Deleted)
[place]  [phone] [phone]  ID: Jennifer Sanders OB: [birth year]  MR#: [id number]  PYP#:[id number]  Patient Care Team: Valerie Roys, DO as PCP - General (Family Medicine)  CHIEF COMPLAINT: Stage Ia ER/PR positive, HER-2 negative invasive carcinoma of the upper outer quadrant left breast.  Oncotype DX score low risk 8.  INTERVAL HISTORY: Patient returns to clinic today for further evaluation and treatment plan.  She underwent lumpectomy on January 01, 2021 confirming the above-stated malignancy.  She tolerated her procedure well without significant side effects.  She continues to have mild tenderness at her surgical site, but otherwise feels well.  She has no neurologic complaints.  She denies any recent fevers or illnesses.  She has a good appetite and denies weight loss.  She has no chest pain, shortness of breath, cough, or hemoptysis.  She denies any nausea, vomiting, constipation, or diarrhea.  She has no urinary complaints.  Patient offers no further specific complaints today.  REVIEW OF SYSTEMS:   Review of Systems  Constitutional: Negative.  Negative for fever and malaise/fatigue.  Respiratory: Negative.  Negative for cough, hemoptysis and shortness of breath.   Cardiovascular: Negative.  Negative for chest pain and leg swelling.  Gastrointestinal: Negative.  Negative for abdominal pain.  Genitourinary: Negative.  Negative for dysuria.  Musculoskeletal: Negative.  Negative for back pain.  Skin: Negative.  Negative for rash.  Neurological: Negative.  Negative for dizziness, focal weakness, weakness and headaches.  Psychiatric/Behavioral: Negative.  The patient is not nervous/anxious.     As per HPI. Otherwise, a complete review of systems is negative.  PAST MEDICAL HISTORY: Past Medical History:  Diagnosis Date  . Bilateral carpal tunnel syndrome   . Diverticulosis    found on colonoscopy on 05/24/16  . Hypertension   .  Meralgia paraesthetica, left     PAST SURGICAL HISTORY: Past Surgical History:  Procedure Laterality Date  . BREAST BIOPSY Left 12/18/2020   [facility]  . BREAST LUMPECTOMY,RADIO FREQ LOCALIZER,AXILLARY SENTINEL LYMPH NODE BIOPSY Left 01/01/2021   Procedure: BREAST LUMPECTOMY,RADIO FREQ LOCALIZER,AXILLARY SENTINEL LYMPH NODE BIOPSY;  Surgeon: Ronny Bacon, MD;  Location: [facility] ORS;  Service: General;  Laterality: Left;  Left axilla  . CHOLECYSTECTOMY  1990s  . COLONOSCOPY  05/24/2016   internal hemorrhoids, diverticulosis    FAMILY HISTORY: Family History  Problem Relation Age of Onset  . Hyperlipidemia Mother   . Cancer Mother        lung  . Cancer Father        esophageal  . Throat cancer Maternal Grandmother   . Breast cancer Neg Hx     ADVANCED DIRECTIVES (Y/N):  N  HEALTH MAINTENANCE: Social History   Tobacco Use  . Smoking status: Never Smoker  . Smokeless tobacco: Never Used  Vaping Use  . Vaping Use: Never used  Substance Use Topics  . Alcohol use: No  . Drug use: No     Colonoscopy:  PAP:  Bone density:  Lipid panel:  No Known Allergies  Current Outpatient Medications  Medication Sig Dispense Refill  . acetaminophen (TYLENOL) 500 MG tablet Take 500-1,000 mg by mouth every 6 (six) hours as needed (pain).    Marland Kitchen desonide (DESOWEN) 0.05 % lotion Apply 1 application topically 2 (two) times daily as needed (skin irritation.).    Marland Kitchen etodolac (LODINE) 500 MG tablet Take 1 tablet (500 mg total) by mouth 2 (two) times daily as needed. 60 tablet 3  . ibuprofen (ADVIL) 200  MG tablet Take 400-600 mg by mouth every 6 (six) hours as needed (pain.).    Marland Kitchen lisinopril-hydrochlorothiazide (ZESTORETIC) 20-12.5 MG tablet Take 1 tablet by mouth daily. 90 tablet 1  . pyridoxine (B-6) 200 MG tablet Take 200 mg by mouth daily.    . silver sulfADIAZINE (SILVADENE) 1 % cream Apply 1 application topically 2 (two) times daily. 85 g 2   No current facility-administered medications for  this visit.    OBJECTIVE: There were no vitals filed for this visit.   There is no height or weight on file to calculate BMI.    ECOG FS:0 - Asymptomatic  General: Well-developed, well-nourished, no acute distress. Eyes: Pink conjunctiva, anicteric sclera. HEENT: Normocephalic, moist mucous membranes. Breast: Left breast with healing surgical scar.  Exam performed by another provider. Lungs: No audible wheezing or coughing. Heart: Regular rate and rhythm. Abdomen: Soft, nontender, no obvious distention. Musculoskeletal: No edema, cyanosis, or clubbing. Neuro: Alert, answering all questions appropriately. Cranial nerves grossly intact. Skin: No rashes or petechiae noted. Psych: Normal affect.  LAB RESULTS:  Lab Results  Component Value Date   NA 140 03/18/2021   K 4.6 03/18/2021   CL 101 03/18/2021   CO2 24 03/18/2021   GLUCOSE 106 (H) 03/18/2021   BUN 7 (L) 03/18/2021   CREATININE 0.86 03/18/2021   CALCIUM 9.7 03/18/2021   PROT 7.2 03/18/2021   ALBUMIN 4.2 03/18/2021   AST 21 03/18/2021   ALT 16 03/18/2021   ALKPHOS 75 03/18/2021   BILITOT 0.5 03/18/2021   GFRNONAA >60 12/24/2020   GFRAA 77 09/16/2020    Lab Results  Component Value Date   WBC 7.0 03/23/2021   NEUTROABS 5.0 03/18/2021   HGB 13.8 03/23/2021   HCT 41.4 03/23/2021   MCV 83.3 03/23/2021   PLT 213 03/23/2021     STUDIES: No results found.  ASSESSMENT: Stage Ia ER/PR positive, HER-2 negative invasive carcinoma of the upper outer quadrant left breast.  Oncotype DX score low risk 8.  PLAN:    1. Stage Ia ER/PR positive, HER-2 negative invasive carcinoma of the upper outer quadrant left breast: Oncotype DX score low risk 8.  Given patient's stage of disease and low risk Oncotype, she does not require adjuvant chemotherapy.  She had consultation with radiation oncology today and will proceed with adjuvant XRT in approximately 1 week.  Given the ER/PR status of her malignancy she will benefit from an  aromatase inhibitor at the conclusion of her treatment.  Return to clinic at the end of XRT for further evaluation and initiation of letrozole.    I spent a total of 30 minutes reviewing chart data, face-to-face evaluation with the patient, counseling and coordination of care as detailed above.   Patient expressed understanding and was in agreement with this plan. She also understands that She can call clinic at any time with any questions, concerns, or complaints.   Cancer Staging Malignant neoplasm of upper-outer quadrant of left female breast [facility]) Staging form: Breast, AJCC 8th Edition - Pathologic stage from 01/22/2021: Stage IA (pT1c, pN0, cM0, G1, ER+, PR+, HER2-, Oncotype DX score: 8) - Signed by Lloyd Huger, MD on 01/22/2021 Stage prefix: Initial diagnosis Multigene prognostic tests performed: Oncotype DX Recurrence score range: Less than 11 Histologic grading system: 3 grade system   Lloyd Huger, MD   03/27/2021 7:24 AM

## 2021-03-29 ENCOUNTER — Ambulatory Visit: Payer: 59

## 2021-03-29 ENCOUNTER — Ambulatory Visit
Admission: RE | Admit: 2021-03-29 | Discharge: 2021-03-29 | Disposition: A | Discharge status: Home / Self Care | Payer: 59 | Source: Ambulatory Visit | Attending: Radiation Oncology | Admitting: Radiation Oncology

## 2021-03-29 DIAGNOSIS — Z510 Encounter for antineoplastic radiation therapy: Secondary | ICD-10-CM | POA: Diagnosis present

## 2021-03-29 DIAGNOSIS — C50412 Malignant neoplasm of upper-outer quadrant of left female breast: Secondary | ICD-10-CM | POA: Insufficient documentation

## 2021-03-30 ENCOUNTER — Ambulatory Visit
Admission: RE | Admit: 2021-03-30 | Discharge: 2021-03-30 | Disposition: A | Discharge status: Home / Self Care | Payer: 59 | Source: Ambulatory Visit | Attending: Radiation Oncology | Admitting: Radiation Oncology

## 2021-03-30 ENCOUNTER — Inpatient Hospital Stay: Payer: 59 | Admitting: Oncology

## 2021-03-30 ENCOUNTER — Ambulatory Visit: Payer: 59

## 2021-03-30 DIAGNOSIS — C50412 Malignant neoplasm of upper-outer quadrant of left female breast: Secondary | ICD-10-CM | POA: Diagnosis not present

## 2021-03-31 ENCOUNTER — Ambulatory Visit
Admission: RE | Admit: 2021-03-31 | Discharge: 2021-03-31 | Disposition: A | Discharge status: Home / Self Care | Payer: 59 | Source: Ambulatory Visit | Attending: Radiation Oncology | Admitting: Radiation Oncology

## 2021-03-31 ENCOUNTER — Ambulatory Visit: Payer: 59

## 2021-03-31 DIAGNOSIS — C50412 Malignant neoplasm of upper-outer quadrant of left female breast: Secondary | ICD-10-CM | POA: Diagnosis not present

## 2021-04-01 ENCOUNTER — Ambulatory Visit: Payer: 59

## 2021-04-01 ENCOUNTER — Ambulatory Visit
Admission: RE | Admit: 2021-04-01 | Discharge: 2021-04-01 | Disposition: A | Discharge status: Home / Self Care | Payer: 59 | Source: Ambulatory Visit | Attending: Radiation Oncology | Admitting: Radiation Oncology

## 2021-04-01 DIAGNOSIS — C50412 Malignant neoplasm of upper-outer quadrant of left female breast: Secondary | ICD-10-CM | POA: Diagnosis not present

## 2021-04-02 ENCOUNTER — Ambulatory Visit
Admission: RE | Admit: 2021-04-02 | Discharge: 2021-04-02 | Disposition: A | Discharge status: Home / Self Care | Payer: 59 | Source: Ambulatory Visit | Attending: Radiation Oncology | Admitting: Radiation Oncology

## 2021-04-02 ENCOUNTER — Ambulatory Visit: Payer: 59

## 2021-04-02 DIAGNOSIS — C50412 Malignant neoplasm of upper-outer quadrant of left female breast: Secondary | ICD-10-CM | POA: Diagnosis not present

## 2021-04-05 ENCOUNTER — Ambulatory Visit: Payer: 59

## 2021-04-05 ENCOUNTER — Ambulatory Visit
Admission: RE | Admit: 2021-04-05 | Discharge: 2021-04-05 | Disposition: A | Discharge status: Home / Self Care | Payer: 59 | Source: Ambulatory Visit | Attending: Radiation Oncology | Admitting: Radiation Oncology

## 2021-04-05 DIAGNOSIS — C50412 Malignant neoplasm of upper-outer quadrant of left female breast: Secondary | ICD-10-CM | POA: Diagnosis not present

## 2021-04-06 ENCOUNTER — Ambulatory Visit
Admission: RE | Admit: 2021-04-06 | Discharge: 2021-04-06 | Disposition: A | Discharge status: Home / Self Care | Payer: 59 | Source: Ambulatory Visit | Attending: Radiation Oncology | Admitting: Radiation Oncology

## 2021-04-06 DIAGNOSIS — C50412 Malignant neoplasm of upper-outer quadrant of left female breast: Secondary | ICD-10-CM | POA: Diagnosis not present

## 2021-04-07 ENCOUNTER — Ambulatory Visit
Admission: RE | Admit: 2021-04-07 | Discharge: 2021-04-07 | Disposition: A | Discharge status: Home / Self Care | Payer: 59 | Source: Ambulatory Visit | Attending: Radiation Oncology | Admitting: Radiation Oncology

## 2021-04-07 DIAGNOSIS — C50412 Malignant neoplasm of upper-outer quadrant of left female breast: Secondary | ICD-10-CM | POA: Diagnosis not present

## 2021-04-08 ENCOUNTER — Ambulatory Visit
Admission: RE | Admit: 2021-04-08 | Discharge: 2021-04-08 | Disposition: A | Discharge status: Home / Self Care | Payer: 59 | Source: Ambulatory Visit | Attending: Radiation Oncology | Admitting: Radiation Oncology

## 2021-04-08 DIAGNOSIS — C50412 Malignant neoplasm of upper-outer quadrant of left female breast: Secondary | ICD-10-CM | POA: Diagnosis not present

## 2021-04-08 NOTE — Progress Notes (Signed)
[place]  [phone] [phone]) [phone]  ID: Jennifer Sanders OB: [birth year]  MR#: [id number]  CSN#:[id number]  Patient Care Team: Valerie Roys, DO as PCP - General (Family Medicine)  CHIEF COMPLAINT: Stage Ia ER/PR positive, HER-2 negative invasive carcinoma of the upper outer quadrant left breast.  Oncotype DX score low risk 8.  INTERVAL HISTORY: Patient returns to clinic today for further evaluation and initiation of letrozole.  She completed XRT yesterday and other than some mild skin erythema and breakdown she tolerated her treatments well.  She currently feels well and is asymptomatic. She has no neurologic complaints.  She denies any recent fevers or illnesses.  She has a good appetite and denies weight loss.  She has no chest pain, shortness of breath, cough, or hemoptysis.  She denies any nausea, vomiting, constipation, or diarrhea.  She has no urinary complaints.  Patient offers no further specific complaints today.  REVIEW OF SYSTEMS:   Review of Systems  Constitutional: Negative.  Negative for fever and malaise/fatigue.  Respiratory: Negative.  Negative for cough, hemoptysis and shortness of breath.   Cardiovascular: Negative.  Negative for chest pain and leg swelling.  Gastrointestinal: Negative.  Negative for abdominal pain.  Genitourinary: Negative.  Negative for dysuria.  Musculoskeletal: Negative.  Negative for back pain.  Skin: Negative.  Negative for rash.  Neurological: Negative.  Negative for dizziness, focal weakness, weakness and headaches.  Psychiatric/Behavioral: Negative.  The patient is not nervous/anxious.     As per HPI. Otherwise, a complete review of systems is negative.  PAST MEDICAL HISTORY: Past Medical History:  Diagnosis Date  . Bilateral carpal tunnel syndrome   . Diverticulosis    found on colonoscopy on 05/24/16  . Hypertension   . Meralgia paraesthetica, left     PAST SURGICAL HISTORY: Past Surgical  History:  Procedure Laterality Date  . BREAST BIOPSY Left 12/18/2020   [facility]  . BREAST LUMPECTOMY,RADIO FREQ LOCALIZER,AXILLARY SENTINEL LYMPH NODE BIOPSY Left 01/01/2021   Procedure: BREAST LUMPECTOMY,RADIO FREQ LOCALIZER,AXILLARY SENTINEL LYMPH NODE BIOPSY;  Surgeon: Ronny Bacon, MD;  Location: [facility] ORS;  Service: General;  Laterality: Left;  Left axilla  . CHOLECYSTECTOMY  1990s  . COLONOSCOPY  05/24/2016   internal hemorrhoids, diverticulosis    FAMILY HISTORY: Family History  Problem Relation Age of Onset  . Hyperlipidemia Mother   . Cancer Mother        lung  . Cancer Father        esophageal  . Throat cancer Maternal Grandmother   . Breast cancer Neg Hx     ADVANCED DIRECTIVES (Y/N):  N  HEALTH MAINTENANCE: Social History   Tobacco Use  . Smoking status: Never Smoker  . Smokeless tobacco: Never Used  Vaping Use  . Vaping Use: Never used  Substance Use Topics  . Alcohol use: No  . Drug use: No     Colonoscopy:  PAP:  Bone density:  Lipid panel:  No Known Allergies  Current Outpatient Medications  Medication Sig Dispense Refill  . acetaminophen (TYLENOL) 500 MG tablet Take 500-1,000 mg by mouth every 6 (six) hours as needed (pain).    Marland Kitchen desonide (DESOWEN) 0.05 % lotion Apply 1 application topically 2 (two) times daily as needed (skin irritation.).    Marland Kitchen etodolac (LODINE) 500 MG tablet Take 1 tablet (500 mg total) by mouth 2 (two) times daily as needed. 60 tablet 3  . letrozole (FEMARA) 2.5 MG tablet Take 1 tablet (2.5 mg total) by mouth  daily. 30 tablet 3  . lisinopril-hydrochlorothiazide (ZESTORETIC) 20-12.5 MG tablet Take 1 tablet by mouth daily. 90 tablet 1  . pyridoxine (B-6) 200 MG tablet Take 200 mg by mouth daily.     No current facility-administered medications for this visit.    OBJECTIVE: Vitals:   04/13/21 1021  BP: 118/77  Pulse: 75  Resp: 20  Temp: 97.7 F (36.5 C)     Body mass index is 36.79 kg/m.    ECOG FS:0 -  Asymptomatic  General: Well-developed, well-nourished, no acute distress. Eyes: Pink conjunctiva, anicteric sclera. HEENT: Normocephalic, moist mucous membranes. Breasts: Left breast and axilla with erythema, no ulceration noted. Lungs: No audible wheezing or coughing. Heart: Regular rate and rhythm. Abdomen: Soft, nontender, no obvious distention. Musculoskeletal: No edema, cyanosis, or clubbing. Neuro: Alert, answering all questions appropriately. Cranial nerves grossly intact. Skin: No rashes or petechiae noted. Psych: Normal affect.  LAB RESULTS:  Lab Results  Component Value Date   NA 140 03/18/2021   K 4.6 03/18/2021   CL 101 03/18/2021   CO2 24 03/18/2021   GLUCOSE 106 (H) 03/18/2021   BUN 7 (L) 03/18/2021   CREATININE 0.86 03/18/2021   CALCIUM 9.7 03/18/2021   PROT 7.2 03/18/2021   ALBUMIN 4.2 03/18/2021   AST 21 03/18/2021   ALT 16 03/18/2021   ALKPHOS 75 03/18/2021   BILITOT 0.5 03/18/2021   GFRNONAA >60 12/24/2020   GFRAA 77 09/16/2020    Lab Results  Component Value Date   WBC 7.0 03/23/2021   NEUTROABS 5.0 03/18/2021   HGB 13.8 03/23/2021   HCT 41.4 03/23/2021   MCV 83.3 03/23/2021   PLT 213 03/23/2021     STUDIES: No results found.  ASSESSMENT: Stage Ia ER/PR positive, HER-2 negative invasive carcinoma of the upper outer quadrant left breast.  Oncotype DX score low risk 8.  PLAN:    1. Stage Ia ER/PR positive, HER-2 negative invasive carcinoma of the upper outer quadrant left breast: Oncotype DX score low risk 8.  Given patient's stage of disease and low risk Oncotype, she did not require adjuvant chemotherapy.  Patient completed XRT on Apr 12, 2021.  She has given a prescription for letrozole today which she will take for a total of 5 years completing treatment in May 2027.  We will get a baseline bone mineral density for completeness.  Return to clinic in 3 months for routine evaluation.    I spent a total of 20 minutes reviewing chart data,  face-to-face evaluation with the patient, counseling and coordination of care as detailed above.   Patient expressed understanding and was in agreement with this plan. She also understands that She can call clinic at any time with any questions, concerns, or complaints.   Cancer Staging Malignant neoplasm of upper-outer quadrant of left female breast [facility]) Staging form: Breast, AJCC 8th Edition - Pathologic stage from 01/22/2021: Stage IA (pT1c, pN0, cM0, G1, ER+, PR+, HER2-, Oncotype DX score: 8) - Signed by Lloyd Huger, MD on 01/22/2021 Stage prefix: Initial diagnosis Multigene prognostic tests performed: Oncotype DX Recurrence score range: Less than 11 Histologic grading system: 3 grade system   Lloyd Huger, MD   04/13/2021 11:02 AM

## 2021-04-09 ENCOUNTER — Ambulatory Visit
Admission: RE | Admit: 2021-04-09 | Discharge: 2021-04-09 | Disposition: A | Discharge status: Home / Self Care | Payer: 59 | Source: Ambulatory Visit | Attending: Radiation Oncology | Admitting: Radiation Oncology

## 2021-04-09 DIAGNOSIS — C50412 Malignant neoplasm of upper-outer quadrant of left female breast: Secondary | ICD-10-CM | POA: Diagnosis not present

## 2021-04-12 ENCOUNTER — Ambulatory Visit
Admission: RE | Admit: 2021-04-12 | Discharge: 2021-04-12 | Disposition: A | Discharge status: Home / Self Care | Payer: 59 | Source: Ambulatory Visit | Attending: Radiation Oncology | Admitting: Radiation Oncology

## 2021-04-12 DIAGNOSIS — C50412 Malignant neoplasm of upper-outer quadrant of left female breast: Secondary | ICD-10-CM | POA: Diagnosis not present

## 2021-04-13 ENCOUNTER — Inpatient Hospital Stay: Payer: 59 | Attending: Oncology | Admitting: Oncology

## 2021-04-13 ENCOUNTER — Encounter: Payer: Self-pay | Admitting: Oncology

## 2021-04-13 VITALS — BP 118/77 | HR 75 | Temp 97.7°F | Resp 20 | Wt 194.7 lb

## 2021-04-13 DIAGNOSIS — Z923 Personal history of irradiation: Secondary | ICD-10-CM | POA: Insufficient documentation

## 2021-04-13 DIAGNOSIS — G5712 Meralgia paresthetica, left lower limb: Secondary | ICD-10-CM | POA: Insufficient documentation

## 2021-04-13 DIAGNOSIS — Z170 Estrogen receptor positive status [ER+]: Secondary | ICD-10-CM | POA: Insufficient documentation

## 2021-04-13 DIAGNOSIS — Z79811 Long term (current) use of aromatase inhibitors: Secondary | ICD-10-CM | POA: Diagnosis not present

## 2021-04-13 DIAGNOSIS — C50412 Malignant neoplasm of upper-outer quadrant of left female breast: Secondary | ICD-10-CM | POA: Diagnosis present

## 2021-04-13 DIAGNOSIS — I10 Essential (primary) hypertension: Secondary | ICD-10-CM | POA: Diagnosis not present

## 2021-04-13 DIAGNOSIS — Z79899 Other long term (current) drug therapy: Secondary | ICD-10-CM | POA: Diagnosis not present

## 2021-04-13 DIAGNOSIS — Z780 Asymptomatic menopausal state: Secondary | ICD-10-CM | POA: Diagnosis not present

## 2021-04-13 MED ORDER — LETROZOLE 2.5 MG PO TABS: 2.5000 mg | ORAL_TABLET | Freq: Every day | ORAL | 3 refills | Status: DC

## 2021-04-13 NOTE — Progress Notes (Signed)
Patient denies any concerns today.  

## 2021-04-19 ENCOUNTER — Other Ambulatory Visit: Payer: Self-pay

## 2021-04-19 ENCOUNTER — Ambulatory Visit
Admission: RE | Admit: 2021-04-19 | Discharge: 2021-04-19 | Disposition: A | Discharge status: Home / Self Care | Payer: 59 | Source: Ambulatory Visit | Attending: Oncology | Admitting: Oncology

## 2021-04-19 DIAGNOSIS — Z780 Asymptomatic menopausal state: Secondary | ICD-10-CM | POA: Insufficient documentation

## 2021-05-17 ENCOUNTER — Other Ambulatory Visit: Payer: Self-pay

## 2021-05-17 ENCOUNTER — Ambulatory Visit
Admission: RE | Admit: 2021-05-17 | Discharge: 2021-05-17 | Disposition: A | Discharge status: Home / Self Care | Payer: 59 | Source: Ambulatory Visit | Attending: Radiation Oncology | Admitting: Radiation Oncology

## 2021-05-17 VITALS — BP 118/82 | HR 72 | Temp 97.4°F | Resp 16 | Wt 183.6 lb

## 2021-05-17 DIAGNOSIS — Z79811 Long term (current) use of aromatase inhibitors: Secondary | ICD-10-CM | POA: Insufficient documentation

## 2021-05-17 DIAGNOSIS — Z923 Personal history of irradiation: Secondary | ICD-10-CM | POA: Insufficient documentation

## 2021-05-17 DIAGNOSIS — Z170 Estrogen receptor positive status [ER+]: Secondary | ICD-10-CM | POA: Diagnosis not present

## 2021-05-17 DIAGNOSIS — C50412 Malignant neoplasm of upper-outer quadrant of left female breast: Secondary | ICD-10-CM | POA: Insufficient documentation

## 2021-05-17 NOTE — Progress Notes (Signed)
Radiation Oncology Follow up Note  Name: Jennifer Sanders   Date:   05/17/2021 MRN:  [id number] DOB: 1956-08-15    This 65 y.o. female presents to the clinic today for 1 month follow-up status post whole breast radiation of her left breast status post wide local excision and sentinel node biopsy ER/PR positive.  REFERRING PROVIDER: Valerie Roys, DO  HPI: Patient is a 65 year old female now out 1 month having completed whole breast radiation to her left breast status post wide local excision and sentinel node biopsy for an ER/PR positive HER2 negative stage Ia invasive mammary carcinoma.  Seen today in routine follow-up she is doing well well.  She specifically denies breast tenderness cough or bone pain..  Patient has been started on Femara is tolerating it well without side effect.  COMPLICATIONS OF TREATMENT: none  FOLLOW UP COMPLIANCE: keeps appointments   PHYSICAL EXAM:  BP 118/82   Pulse 72   Temp (!) 97.4 F (36.3 C) (Tympanic)   Resp 16   Wt 183 lb 9.6 oz (83.3 kg)   BMI 34.69 kg/m  Lungs are clear to A&P cardiac examination essentially unremarkable with regular rate and rhythm. No dominant mass or nodularity is noted in either breast in 2 positions examined. Incision is well-healed. No axillary or supraclavicular adenopathy is appreciated. Cosmetic result is excellent.  Well-developed well-nourished patient in NAD. HEENT reveals PERLA, EOMI, discs not visualized.  Oral cavity is clear. No oral mucosal lesions are identified. Neck is clear without evidence of cervical or supraclavicular adenopathy. Lungs are clear to A&P. Cardiac examination is essentially unremarkable with regular rate and rhythm without murmur rub or thrill. Abdomen is benign with no organomegaly or masses noted. Motor sensory and DTR levels are equal and symmetric in the upper and lower extremities. Cranial nerves II through XII are grossly intact. Proprioception is intact. No peripheral adenopathy or edema is  identified. No motor or sensory levels are noted. Crude visual fields are within normal range.  RADIOLOGY RESULTS: No current films for review  PLAN: Present time patient is doing well with no evidence of disease low side effect profile status post whole breast radiation 1 month prior.  She continues on letrozole at this time without side effect.  I have asked to see her back in 4 to 5 months for follow-up.  She knows to call with any concerns.  I would like to take this opportunity to thank you for allowing me to participate in the care of your patient.Noreene Filbert, MD

## 2021-06-14 ENCOUNTER — Encounter: Payer: Self-pay | Admitting: Family Medicine

## 2021-06-14 ENCOUNTER — Telehealth (INDEPENDENT_AMBULATORY_CARE_PROVIDER_SITE_OTHER): Payer: 59 | Admitting: Family Medicine

## 2021-06-14 ENCOUNTER — Other Ambulatory Visit: Payer: Self-pay

## 2021-06-14 VITALS — BP 125/70 | Temp 98.6°F

## 2021-06-14 DIAGNOSIS — U071 COVID-19: Secondary | ICD-10-CM | POA: Diagnosis not present

## 2021-06-14 MED ORDER — HYDROCOD POLST-CPM POLST ER 10-8 MG/5ML PO SUER: 5.0000 mL | Freq: Two times a day (BID) | ORAL | 0 refills | Status: DC | PRN

## 2021-06-14 MED ORDER — MOLNUPIRAVIR EUA 200MG CAPSULE
4.0000 | ORAL_CAPSULE | Freq: Two times a day (BID) | ORAL | 0 refills | Status: AC
  Filled 2021-06-14: qty 40, 30d supply, fill #0

## 2021-06-14 MED ORDER — BENZONATATE 200 MG PO CAPS: 200.0000 mg | ORAL_CAPSULE | Freq: Two times a day (BID) | ORAL | 0 refills | Status: DC | PRN

## 2021-06-14 NOTE — Progress Notes (Signed)
BP 125/70   Temp 98.6 F (37 C) (Oral)    Subjective:    Patient ID: Jennifer Sanders, female    DOB: Mar 25, 1956, 65 y.o.   MRN: [id number]  HPI: Jennifer Sanders is a 65 y.o. female  Chief Complaint  Patient presents with   Covid Positive    Patient states she tested positive for covid. States she has symptoms of body aches, sore throat, cough, runny nose, fever, nausea that all started last night    UPPER RESPIRATORY TRACT INFECTION- covid+ this AM Duration: 1 day Worst symptom: tired Fever: yes Cough: yes Shortness of breath: no Wheezing: no Chest pain: no Chest tightness: no Chest congestion: no Nasal congestion: yes Runny nose: yes Post nasal drip: yes Sneezing: no Sore throat: yes Swollen glands: no Sinus pressure: no Headache: no Face pain: no Toothache: no Ear pain: no  Ear pressure: no  Eyes red/itching:no Eye drainage/crusting: no  Vomiting: no Rash: no Fatigue: yes Sick contacts: yes Strep contacts: no  Context: worse Recurrent sinusitis: no Relief with OTC cold/cough medications: no  Treatments attempted: cold/sinus, mucinex, anti-histamine, and pseudoephedrine    Relevant past medical, surgical, family and social history reviewed and updated as indicated. Interim medical history since our last visit reviewed. Allergies and medications reviewed and updated.  Review of Systems  Constitutional:  Positive for chills, diaphoresis, fatigue and fever. Negative for activity change, appetite change and unexpected weight change.  HENT:  Positive for congestion, postnasal drip, rhinorrhea and sore throat. Negative for dental problem, drooling, ear discharge, ear pain, facial swelling, hearing loss, mouth sores, nosebleeds, sinus pressure, sinus pain, sneezing, tinnitus, trouble swallowing and voice change.   Respiratory:  Positive for cough. Negative for apnea, choking, chest tightness, shortness of breath, wheezing and stridor.   Cardiovascular: Negative.    Gastrointestinal: Negative.   Musculoskeletal: Negative.   Psychiatric/Behavioral: Negative.     Per HPI unless specifically indicated above     Objective:    BP 125/70   Temp 98.6 F (37 C) (Oral)   Wt Readings from Last 3 Encounters:  05/17/21 183 lb 9.6 oz (83.3 kg)  04/13/21 194 lb 11.2 oz (88.3 kg)  03/18/21 195 lb (88.5 kg)    Physical Exam Vitals and nursing note reviewed.  Pulmonary:     Effort: Pulmonary effort is normal. No respiratory distress.     Comments: Speaking in full sentences Neurological:     Mental Status: She is alert.  Psychiatric:        Mood and Affect: Mood normal.        Behavior: Behavior normal.        Thought Content: Thought content normal.        Judgment: Judgment normal.    Results for orders placed or performed in visit on 03/23/21  CBC  Result Value Ref Range   WBC 7.0 4.0 - 10.5 K/uL   RBC 4.97 3.87 - 5.11 MIL/uL   Hemoglobin 13.8 12.0 - 15.0 g/dL   HCT 41.4 36.0 - 46.0 %   MCV 83.3 80.0 - 100.0 fL   MCH 27.8 26.0 - 34.0 pg   MCHC 33.3 30.0 - 36.0 g/dL   RDW 13.6 11.5 - 15.5 %   Platelets 213 150 - 400 K/uL   nRBC 0.0 0.0 - 0.2 %      Assessment & Plan:   Problem List Items Addressed This Visit   None Visit Diagnoses     COVID-19    -  Primary   Will treat with mulnopirovir, tussionex and tessalon. Call if not getting better or getting worse. Continue to monitor.    Relevant Medications   molnupiravir EUA 200 mg CAPS        Follow up plan: Return if symptoms worsen or fail to improve.   This visit was completed via telephone due to the restrictions of the COVID-19 pandemic. All issues as above were discussed and addressed but no physical exam was performed. If it was felt that the patient should be evaluated in the office, they were directed there. The patient verbally consented to this visit. Patient was unable to complete an audio/visual visit due to Technical difficulties. Due to the catastrophic nature of the  COVID-19 pandemic, this visit was done through audio contact only. Location of the patient: home Location of the provider: work Those involved with this call:  Provider: Park Liter, DO CMA: Yvonna Alanis, [place] Desk/Registration: Jill Side  Time spent on call:  21 minutes on the phone discussing health concerns. 30 minutes total spent in review of patient's record and preparation of their chart.

## 2021-07-05 ENCOUNTER — Other Ambulatory Visit: Payer: Self-pay

## 2021-07-16 NOTE — Progress Notes (Signed)
[facility]  [phone] [phone]  ID: Jennifer Sanders OB: 08-Nov-1956  MR#: [id number]  AFB#:[id number]  Patient Care Team: Valerie Roys, DO as PCP - General (Family Medicine)  CHIEF COMPLAINT: Stage Ia ER/PR positive, HER-2 negative invasive carcinoma of the upper outer quadrant left breast.  Oncotype DX score low risk 8.  INTERVAL HISTORY: Patient returns to clinic today for routine 35-monthevaluation and to assess her toleration of letrozole.  She currently feels well and is asymptomatic.  She is tolerating letrozole without significant side effects. She has no neurologic complaints.  She denies any recent fevers or illnesses.  She has a good appetite and denies weight loss.  She has no chest pain, shortness of breath, cough, or hemoptysis.  She denies any nausea, vomiting, constipation, or diarrhea.  She has no urinary complaints.  Patient offers no specific complaints today.  REVIEW OF SYSTEMS:   Review of Systems  Constitutional: Negative.  Negative for fever and malaise/fatigue.  Respiratory: Negative.  Negative for cough, hemoptysis and shortness of breath.   Cardiovascular: Negative.  Negative for chest pain and leg swelling.  Gastrointestinal: Negative.  Negative for abdominal pain.  Genitourinary: Negative.  Negative for dysuria.  Musculoskeletal: Negative.  Negative for back pain.  Skin: Negative.  Negative for rash.  Neurological: Negative.  Negative for dizziness, focal weakness, weakness and headaches.  Psychiatric/Behavioral: Negative.  The patient is not nervous/anxious.    As per HPI. Otherwise, a complete review of systems is negative.  PAST MEDICAL HISTORY: Past Medical History:  Diagnosis Date   Bilateral carpal tunnel syndrome    Diverticulosis    found on colonoscopy on 05/24/16   Hypertension    Meralgia paraesthetica, left     PAST SURGICAL HISTORY: Past Surgical History:  Procedure Laterality Date   BREAST  BIOPSY Left 12/18/2020   I[place]  BREAST LUMPECTOMY,RADIO FREQ LOCALIZER,AXILLARY SENTINEL LYMPH NODE BIOPSY Left 01/01/2021   Procedure: BREAST LUMPECTOMY,RADIO FREQ LOCALIZER,AXILLARY SENTINEL LYMPH NODE BIOPSY;  Surgeon: RRonny Bacon MD;  Location: [facility] ORS;  Service: General;  Laterality: Left;  Left axilla   CHOLECYSTECTOMY  1990s   COLONOSCOPY  05/24/2016   internal hemorrhoids, diverticulosis    FAMILY HISTORY: Family History  Problem Relation Age of Onset   Hyperlipidemia Mother    Cancer Mother        lung   Cancer Father        esophageal   Throat cancer Maternal Grandmother    Breast cancer Neg Hx     ADVANCED DIRECTIVES (Y/N):  N  HEALTH MAINTENANCE: Social History   Tobacco Use   Smoking status: Never   Smokeless tobacco: Never  Vaping Use   Vaping Use: Never used  Substance Use Topics   Alcohol use: No   Drug use: No     Colonoscopy:  PAP:  Bone density:  Lipid panel:  No Known Allergies  Current Outpatient Medications  Medication Sig Dispense Refill   letrozole (FEMARA) 2.5 MG tablet Take 1 tablet (2.5 mg total) by mouth daily. 30 tablet 3   lisinopril-hydrochlorothiazide (ZESTORETIC) 20-12.5 MG tablet Take 1 tablet by mouth daily. 90 tablet 1   pyridoxine (B-6) 200 MG tablet Take 200 mg by mouth daily.     desonide (DESOWEN) 0.05 % lotion Apply 1 application topically 2 (two) times daily as needed (skin irritation.). (Patient not taking: Reported on 07/20/2021)     No current facility-administered medications for this visit.    OBJECTIVE: Vitals:  07/20/21 1035  BP: 125/83  Pulse: 78  Resp: 18  Temp: (!) 97 F (36.1 C)  SpO2: 99%     Body mass index is 36.28 kg/m.    ECOG FS:0 - Asymptomatic  General: Well-developed, well-nourished, no acute distress. Eyes: Pink conjunctiva, anicteric sclera. HEENT: Normocephalic, moist mucous membranes. Breasts: Exam deferred today.   Lungs: No audible wheezing or coughing. Heart: Regular rate and  rhythm. Abdomen: Soft, nontender, no obvious distention. Musculoskeletal: No edema, cyanosis, or clubbing. Neuro: Alert, answering all questions appropriately. Cranial nerves grossly intact. Skin: No rashes or petechiae noted. Psych: Normal affect.   LAB RESULTS:  Lab Results  Component Value Date   NA 140 03/18/2021   K 4.6 03/18/2021   CL 101 03/18/2021   CO2 24 03/18/2021   GLUCOSE 106 (H) 03/18/2021   BUN 7 (L) 03/18/2021   CREATININE 0.86 03/18/2021   CALCIUM 9.7 03/18/2021   PROT 7.2 03/18/2021   ALBUMIN 4.2 03/18/2021   AST 21 03/18/2021   ALT 16 03/18/2021   ALKPHOS 75 03/18/2021   BILITOT 0.5 03/18/2021   GFRNONAA >60 12/24/2020   GFRAA 77 09/16/2020    Lab Results  Component Value Date   WBC 7.0 03/23/2021   NEUTROABS 5.0 03/18/2021   HGB 13.8 03/23/2021   HCT 41.4 03/23/2021   MCV 83.3 03/23/2021   PLT 213 03/23/2021     STUDIES: No results found.  ASSESSMENT: Stage Ia ER/PR positive, HER-2 negative invasive carcinoma of the upper outer quadrant left breast.  Oncotype DX score low risk 8.  PLAN:    1. Stage Ia ER/PR positive, HER-2 negative invasive carcinoma of the upper outer quadrant left breast: Oncotype DX score low risk 8.  Given patient's stage of disease and low risk Oncotype, she did not require adjuvant chemotherapy.  Patient completed XRT on Apr 12, 2021.  Continue letrozole for a total of 5 years completing treatment in May 2027.  Return to clinic in 6 months for routine evaluation. 2.  Osteopenia: Patient's bone density on Apr 19, 2021 revealed a T score of -1.9.  Recommended calcium and vitamin D supplementation.  Repeat in May 2023.     Patient expressed understanding and was in agreement with this plan. She also understands that She can call clinic at any time with any questions, concerns, or complaints.   Cancer Staging Malignant neoplasm of upper-outer quadrant of left female breast [facility]) Staging form: Breast, AJCC 8th Edition -  Pathologic stage from 01/22/2021: Stage IA (pT1c, pN0, cM0, G1, ER+, PR+, HER2-, Oncotype DX score: 8) - Signed by Lloyd Huger, MD on 01/22/2021 Stage prefix: Initial diagnosis Multigene prognostic tests performed: Oncotype DX Recurrence score range: Less than 11 Histologic grading system: 3 grade system   Lloyd Huger, MD   07/20/2021 1:26 PM

## 2021-07-20 ENCOUNTER — Inpatient Hospital Stay: Payer: 59 | Attending: Oncology | Admitting: Oncology

## 2021-07-20 VITALS — BP 125/83 | HR 78 | Temp 97.0°F | Resp 18 | Wt 192.0 lb

## 2021-07-20 DIAGNOSIS — Z170 Estrogen receptor positive status [ER+]: Secondary | ICD-10-CM | POA: Diagnosis not present

## 2021-07-20 DIAGNOSIS — Z79811 Long term (current) use of aromatase inhibitors: Secondary | ICD-10-CM | POA: Diagnosis not present

## 2021-07-20 DIAGNOSIS — Z923 Personal history of irradiation: Secondary | ICD-10-CM | POA: Diagnosis not present

## 2021-07-20 DIAGNOSIS — M8580 Other specified disorders of bone density and structure, unspecified site: Secondary | ICD-10-CM | POA: Insufficient documentation

## 2021-07-20 DIAGNOSIS — I10 Essential (primary) hypertension: Secondary | ICD-10-CM | POA: Diagnosis not present

## 2021-07-20 DIAGNOSIS — C50412 Malignant neoplasm of upper-outer quadrant of left female breast: Secondary | ICD-10-CM | POA: Insufficient documentation

## 2021-07-26 ENCOUNTER — Other Ambulatory Visit: Payer: Self-pay | Admitting: Oncology

## 2021-07-26 ENCOUNTER — Other Ambulatory Visit: Payer: Self-pay | Admitting: Family Medicine

## 2021-09-17 ENCOUNTER — Encounter: Payer: Self-pay | Admitting: Family Medicine

## 2021-09-17 ENCOUNTER — Other Ambulatory Visit: Payer: Self-pay

## 2021-09-17 ENCOUNTER — Ambulatory Visit (INDEPENDENT_AMBULATORY_CARE_PROVIDER_SITE_OTHER): Payer: Medicare HMO | Admitting: Family Medicine

## 2021-09-17 VITALS — BP 120/81 | HR 87 | Temp 98.5°F | Wt 196.8 lb

## 2021-09-17 DIAGNOSIS — I10 Essential (primary) hypertension: Secondary | ICD-10-CM | POA: Diagnosis not present

## 2021-09-17 MED ORDER — LISINOPRIL-HYDROCHLOROTHIAZIDE 20-12.5 MG PO TABS: 1.0000 | ORAL_TABLET | Freq: Every day | ORAL | 1 refills | Status: DC

## 2021-09-17 NOTE — Assessment & Plan Note (Signed)
Under good control on current regimen. Continue current regimen. Continue to monitor. Call with any concerns. Refills given. Labs drawn today.   

## 2021-09-17 NOTE — Progress Notes (Signed)
BP 120/81   Pulse 87   Temp 98.5 F (36.9 C) (Oral)   Wt 196 lb 12.8 oz (89.3 kg)   SpO2 96%   BMI 37.19 kg/m    Subjective:    Patient ID: Jennifer Sanders, female    DOB: 1956/01/21, 65 y.o.   MRN: [id number]  HPI: Jennifer Sanders is a 65 y.o. female  Chief Complaint  Patient presents with   Hypertension    Patient states she is doing well with her blood pressure.    HYPERTENSION Hypertension status: controlled  Satisfied with current treatment? yes Duration of hypertension: chronic BP monitoring frequency:  not checking BP medication side effects:  no Medication compliance: excellent compliance Previous BP meds:lisinopril-HCTZ Aspirin: no Recurrent headaches: no Visual changes: no Palpitations: no Dyspnea: no Chest pain: no Lower extremity edema: no Dizzy/lightheaded: no  Relevant past medical, surgical, family and social history reviewed and updated as indicated. Interim medical history since our last visit reviewed. Allergies and medications reviewed and updated.  Review of Systems  Constitutional: Negative.   Respiratory: Negative.    Cardiovascular: Negative.   Gastrointestinal: Negative.   Musculoskeletal: Negative.   Psychiatric/Behavioral: Negative.     Per HPI unless specifically indicated above     Objective:    BP 120/81   Pulse 87   Temp 98.5 F (36.9 C) (Oral)   Wt 196 lb 12.8 oz (89.3 kg)   SpO2 96%   BMI 37.19 kg/m   Wt Readings from Last 3 Encounters:  09/17/21 196 lb 12.8 oz (89.3 kg)  07/20/21 192 lb (87.1 kg)  05/17/21 183 lb 9.6 oz (83.3 kg)    Physical Exam Vitals and nursing note reviewed.  Constitutional:      General: She is not in acute distress.    Appearance: Normal appearance. She is not ill-appearing, toxic-appearing or diaphoretic.  HENT:     Head: Normocephalic and atraumatic.     Right Ear: External ear normal.     Left Ear: External ear normal.     Nose: Nose normal.     Mouth/Throat:     Mouth: Mucous  membranes are moist.     Pharynx: Oropharynx is clear.  Eyes:     General: No scleral icterus.       Right eye: No discharge.        Left eye: No discharge.     Extraocular Movements: Extraocular movements intact.     Conjunctiva/sclera: Conjunctivae normal.     Pupils: Pupils are equal, round, and reactive to light.  Cardiovascular:     Rate and Rhythm: Normal rate and regular rhythm.     Pulses: Normal pulses.     Heart sounds: Normal heart sounds. No murmur heard.   No friction rub. No gallop.  Pulmonary:     Effort: Pulmonary effort is normal. No respiratory distress.     Breath sounds: Normal breath sounds. No stridor. No wheezing, rhonchi or rales.  Chest:     Chest wall: No tenderness.  Musculoskeletal:        General: Normal range of motion.     Cervical back: Normal range of motion and neck supple.  Skin:    General: Skin is warm and dry.     Capillary Refill: Capillary refill takes less than 2 seconds.     Coloration: Skin is not jaundiced or pale.     Findings: No bruising, erythema, lesion or rash.  Neurological:     General: No focal  deficit present.     Mental Status: She is alert and oriented to person, place, and time. Mental status is at baseline.  Psychiatric:        Mood and Affect: Mood normal.        Behavior: Behavior normal.        Thought Content: Thought content normal.        Judgment: Judgment normal.    Results for orders placed or performed in visit on 03/23/21  CBC  Result Value Ref Range   WBC 7.0 4.0 - 10.5 K/uL   RBC 4.97 3.87 - 5.11 MIL/uL   Hemoglobin 13.8 12.0 - 15.0 g/dL   HCT 41.4 36.0 - 46.0 %   MCV 83.3 80.0 - 100.0 fL   MCH 27.8 26.0 - 34.0 pg   MCHC 33.3 30.0 - 36.0 g/dL   RDW 13.6 11.5 - 15.5 %   Platelets 213 150 - 400 K/uL   nRBC 0.0 0.0 - 0.2 %      Assessment & Plan:   Problem List Items Addressed This Visit       Cardiovascular and Mediastinum   Hypertension - Primary    Under good control on current regimen.  Continue current regimen. Continue to monitor. Call with any concerns. Refills given. Labs drawn today.       Relevant Medications   lisinopril-hydrochlorothiazide (ZESTORETIC) 20-12.5 MG tablet   Other Relevant Orders   Basic metabolic panel     Follow up plan: Return in about 6 months (around 03/18/2022), or physical.

## 2021-09-18 LAB — BASIC METABOLIC PANEL
BUN/Creatinine Ratio: 11 — ABNORMAL LOW (ref 12–28)
BUN: 9 mg/dL (ref 8–27)
CO2: 24 mmol/L (ref 20–29)
Calcium: 9.9 mg/dL (ref 8.7–10.3)
Chloride: 100 mmol/L (ref 96–106)
Creatinine, Ser: 0.85 mg/dL (ref 0.57–1.00)
Glucose: 97 mg/dL (ref 70–99)
Potassium: 4.1 mmol/L (ref 3.5–5.2)
Sodium: 139 mmol/L (ref 134–144)
eGFR: 76 mL/min/{1.73_m2} (ref >59)

## 2021-10-13 ENCOUNTER — Ambulatory Visit
Admission: RE | Admit: 2021-10-13 | Discharge: 2021-10-13 | Disposition: A | Discharge status: Home / Self Care | Payer: Medicare HMO | Source: Ambulatory Visit | Attending: Radiation Oncology | Admitting: Radiation Oncology

## 2021-10-13 ENCOUNTER — Other Ambulatory Visit: Payer: Self-pay

## 2021-10-13 ENCOUNTER — Encounter: Payer: Self-pay | Admitting: Radiation Oncology

## 2021-10-13 VITALS — BP 120/74 | HR 87 | Temp 97.1°F | Resp 16 | Wt 194.0 lb

## 2021-10-13 DIAGNOSIS — Z79811 Long term (current) use of aromatase inhibitors: Secondary | ICD-10-CM | POA: Insufficient documentation

## 2021-10-13 DIAGNOSIS — Z170 Estrogen receptor positive status [ER+]: Secondary | ICD-10-CM | POA: Diagnosis not present

## 2021-10-13 DIAGNOSIS — Z08 Encounter for follow-up examination after completed treatment for malignant neoplasm: Secondary | ICD-10-CM | POA: Diagnosis not present

## 2021-10-13 DIAGNOSIS — C50412 Malignant neoplasm of upper-outer quadrant of left female breast: Secondary | ICD-10-CM | POA: Insufficient documentation

## 2021-10-13 DIAGNOSIS — Z923 Personal history of irradiation: Secondary | ICD-10-CM | POA: Insufficient documentation

## 2021-10-13 NOTE — Progress Notes (Signed)
Radiation Oncology Follow up Note  Name: Jennifer Sanders   Date:   10/13/2021 MRN:  [id number] DOB: Jun 01, 1956    This 65 y.o. female presents to the clinic today for 18-month follow-up status post whole breast radiation to her left breast status post wide local excision and sentinel node biopsy for ER/PR positive invasive mammary carcinoma.  REFERRING PROVIDER: Valerie Roys, DO  HPI: Patient is a 65 year old female now about 6 months having completed whole breast radiation to her left breast status post wide local excision for an ER/PR positive invasive mammary carcinoma.  Seen today in routine follow-up she is doing well.  She specifically denies breast tenderness cough or bone pain..  She is currently on Femara tolerating it well without side effect.  She is not yet had follow-up mammogram.  COMPLICATIONS OF TREATMENT: none  FOLLOW UP COMPLIANCE: keeps appointments   PHYSICAL EXAM:  BP 120/74 (BP Location: Right Arm, Patient Position: Sitting)   Pulse 87   Temp (!) 97.1 F (36.2 C) (Tympanic)   Resp 16   Wt 194 lb (88 kg)   SpO2 99%   BMI 36.66 kg/m  Lungs are clear to A&P cardiac examination essentially unremarkable with regular rate and rhythm. No dominant mass or nodularity is noted in either breast in 2 positions examined. Incision is well-healed. No axillary or supraclavicular adenopathy is appreciated. Cosmetic result is excellent.  Well-developed well-nourished patient in NAD. HEENT reveals PERLA, EOMI, discs not visualized.  Oral cavity is clear. No oral mucosal lesions are identified. Neck is clear without evidence of cervical or supraclavicular adenopathy. Lungs are clear to A&P. Cardiac examination is essentially unremarkable with regular rate and rhythm without murmur rub or thrill. Abdomen is benign with no organomegaly or masses noted. Motor sensory and DTR levels are equal and symmetric in the upper and lower extremities. Cranial nerves II through XII are grossly  intact. Proprioception is intact. No peripheral adenopathy or edema is identified. No motor or sensory levels are noted. Crude visual fields are within normal range.  RADIOLOGY RESULTS: No current films for review  PLAN: Present time patient is doing well 6 months out from whole breast radiation and pleased with her overall progress.  She continues on Femara without side effect.  Of asked to see her back in 6 months for follow-up.  Patient knows to call with any concerns.  I would like to take this opportunity to thank you for allowing me to participate in the care of your patient.Noreene Filbert, MD

## 2021-10-23 ENCOUNTER — Other Ambulatory Visit: Payer: Self-pay | Admitting: Oncology

## 2021-11-28 HISTORY — PX: DENTAL SURGERY: SHX609

## 2021-12-15 ENCOUNTER — Telehealth: Payer: Self-pay | Admitting: Family Medicine

## 2021-12-15 DIAGNOSIS — C50412 Malignant neoplasm of upper-outer quadrant of left female breast: Secondary | ICD-10-CM

## 2021-12-15 NOTE — Telephone Encounter (Signed)
Copied from [place] [id number]. Topic: General - Inquiry >> Dec 15, 2021  2:51 PM Jennifer Sanders wrote: Reason for CRM: Pt just touching base, been 1 year since had breast cancer and had mammogram, had called to set up annual at [facility] and told PCP must order diagnostic. Pt told [place] she had CPE coming in April and they said that it would be fine to wait till then. Pt just making sure ok with Dr Lenna Sciara. She is not overly concerned at all and is fine with waiting, just letting dr know in case she wanting to do sooner but not pressing as far as she is concerned. May fu at [phone]

## 2021-12-16 NOTE — Telephone Encounter (Signed)
Please advise 

## 2021-12-16 NOTE — Telephone Encounter (Signed)
Will leave for Dr. Wynetta Emery to review.

## 2021-12-21 ENCOUNTER — Other Ambulatory Visit: Payer: Self-pay | Admitting: Family Medicine

## 2021-12-21 ENCOUNTER — Telehealth: Payer: Self-pay | Admitting: Family Medicine

## 2021-12-21 DIAGNOSIS — C50412 Malignant neoplasm of upper-outer quadrant of left female breast: Secondary | ICD-10-CM

## 2021-12-21 NOTE — Telephone Encounter (Signed)
Mammo ordered. Please make sure she's follow up with her oncologist.

## 2021-12-21 NOTE — Telephone Encounter (Signed)
Copied from [place] [id number]. Topic: General - Other >> Dec 21, 2021  3:35 PM Jennifer Sanders A wrote: Reason for CRM: The patient has called to share that [facility] will be submitting an order for an ultrasound to accompany their diagnostic mammogram   The patient would like to be notified when the order is signed   Please contact further when possible

## 2021-12-21 NOTE — Telephone Encounter (Signed)
These have been signed.

## 2021-12-21 NOTE — Telephone Encounter (Signed)
Patient notified of Dr. Durenda Age message.

## 2021-12-21 NOTE — Telephone Encounter (Signed)
FYI to watch for orders.

## 2022-01-18 ENCOUNTER — Ambulatory Visit (INDEPENDENT_AMBULATORY_CARE_PROVIDER_SITE_OTHER): Payer: Medicare HMO | Admitting: *Deleted

## 2022-01-18 DIAGNOSIS — Z0000 Encounter for general adult medical examination without abnormal findings: Secondary | ICD-10-CM

## 2022-01-18 NOTE — Progress Notes (Signed)
Subjective:   Jennifer Sanders is a 66 y.o. female who presents for Medicare Annual (Subsequent) preventive examination.  I connected with  [name] on 01/18/22 by a telephone enabled telemedicine application and verified that I am speaking with the correct person using two identifiers.   I discussed the limitations of evaluation and management by telemedicine. The patient expressed understanding and agreed to proceed.  Patient location: home  Provider location: Tele-Health not in office    Review of Systems     Cardiac Risk Factors include: advanced age (>51men, >67 women);hypertension     Objective:    Today's Vitals   There is no height or weight on file to calculate BMI.  Advanced Directives 01/18/2022 10/13/2021 05/17/2021 01/01/2021 12/24/2020 12/23/2020  Does Patient Have a Medical Advance Directive? Yes Yes Yes No No No  Type of Paramedic of [place];Living will [place];Living will - - -  Does patient want to make changes to medical advance directive? - - No - Patient declined - - -  Copy of [place] in Chart? Yes - validated most recent copy scanned in chart (See row information) - No - copy requested - - -  Would patient like information on creating a medical advance directive? - - - No - Patient declined - Yes (MAU/Ambulatory/Procedural Areas - Information given)    Current Medications (verified) Outpatient Encounter Medications as of 01/18/2022  Medication Sig   letrozole (FEMARA) 2.5 MG tablet Take 1 tablet (2.5 mg total) by mouth daily.   lisinopril-hydrochlorothiazide (ZESTORETIC) 20-12.5 MG tablet Take 1 tablet by mouth daily.   pyridoxine (B-6) 200 MG tablet Take 200 mg by mouth daily.   No facility-administered encounter medications on file as of 01/18/2022.    Allergies (verified) Patient has no known allergies.   History: Past Medical History:  Diagnosis  Date   Bilateral carpal tunnel syndrome    Diverticulosis    found on colonoscopy on 05/24/16   Hypertension    Meralgia paraesthetica, left    Past Surgical History:  Procedure Laterality Date   BREAST BIOPSY Left 12/18/2020   [place]   BREAST LUMPECTOMY,RADIO FREQ LOCALIZER,AXILLARY SENTINEL LYMPH NODE BIOPSY Left 01/01/2021   Procedure: BREAST LUMPECTOMY,RADIO FREQ LOCALIZER,AXILLARY SENTINEL LYMPH NODE BIOPSY;  Surgeon: Ronny Bacon, MD;  Location: [facility] ORS;  Service: General;  Laterality: Left;  Left axilla   CHOLECYSTECTOMY  1990s   COLONOSCOPY  05/24/2016   internal hemorrhoids, diverticulosis   Family History  Problem Relation Age of Onset   Hyperlipidemia Mother    Cancer Mother        lung   Cancer Father        esophageal   Throat cancer Maternal Grandmother    Breast cancer Neg Hx    Social History   Socioeconomic History   Marital status: Married    Spouse name: Not on file   Number of children: Not on file   Years of education: Not on file   Highest education level: Not on file  Occupational History   Not on file  Tobacco Use   Smoking status: Never   Smokeless tobacco: Never  Vaping Use   Vaping Use: Never used  Substance and Sexual Activity   Alcohol use: No   Drug use: No   Sexual activity: Yes  Other Topics Concern   Not on file  Social History Narrative   Not on file   Social  Determinants of Health   Financial Resource Strain: Low Risk    Difficulty of Paying Living Expenses: Not hard at all  Food Insecurity: No Food Insecurity   Worried About [place] in the Last Year: Never true   Ran Out of Food in the Last Year: Never true  Transportation Needs: No Transportation Needs   Lack of Transportation (Medical): No   Lack of Transportation (Non-Medical): No  Physical Activity: Insufficiently Active   Days of Exercise per Week: 1 day   Minutes of Exercise per Session: 10 min  Stress: No Stress Concern Present   Feeling of Stress :  Not at all  Social Connections: Moderately Isolated   Frequency of Communication with Friends and Family: More than three times a week   Frequency of Social Gatherings with Friends and Family: Twice a week   Attends Religious Services: Never   Marine scientist or Organizations: No   Attends Music therapist: Never   Marital Status: Married    Tobacco Counseling Counseling given: Not Answered   Clinical Intake:  Pre-visit preparation completed: Yes  Pain : No/denies pain     Nutritional Risks: None Diabetes: No  How often do you need to have someone help you when you read instructions, pamphlets, or other written materials from your doctor or pharmacy?: 1 - Never  Diabetic?  no  Interpreter Needed?: No  Information entered by :: Leroy Kennedy LPN   Activities of Daily Living In your present state of health, do you have any difficulty performing the following activities: 01/18/2022 03/18/2021  Hearing? N N  Vision? N N  Difficulty concentrating or making decisions? N N  Walking or climbing stairs? N N  Dressing or bathing? N N  Doing errands, shopping? N N  Preparing Food and eating ? N -  Using the Toilet? N -  In the past six months, have you accidently leaked urine? N -  Do you have problems with loss of bowel control? N -  Managing your Medications? N -  Managing your Finances? N -  Housekeeping or managing your Housekeeping? N -  Some recent data might be hidden    Patient Care Team: Valerie Roys, DO as PCP - General (Family Medicine) Noreene Filbert, MD as Consulting Physician (Radiation Oncology) Lloyd Huger, MD as Consulting Physician (Oncology) Ronny Bacon, MD as Consulting Physician (General Surgery)  Indicate any recent Medical Services you may have received from other than [facility] providers in the past year (date may be approximate).     Assessment:   This is a routine wellness examination for  Jennifer Sanders.  Hearing/Vision screen Hearing Screening - Comments:: No trouble hearing Vision Screening - Comments:: Every three years  Wears glasses [place]  Dietary issues and exercise activities discussed: Current Exercise Habits: Home exercise routine, Type of exercise: treadmill, Time (Minutes): 15, Frequency (Times/Week): 1, Weekly Exercise (Minutes/Week): 15, Intensity: Mild   Goals Addressed             This Visit's Progress    Weight (lb) < 200 lb (90.7 kg)       Increase activity        Depression Screen PHQ 2/9 Scores 01/18/2022 01/18/2022 09/17/2021 03/18/2021 09/16/2020 01/25/2019 07/23/2018  PHQ - 2 Score 0 0 0 0 1 0 0  PHQ- 9 Score - - 0 - 1 1 1     Fall Risk Fall Risk  01/18/2022 09/17/2021 03/18/2021 02/18/2021 02/11/2021  Falls in the past  year? 0 0 0 0 0  Number falls in past yr: 0 0 0 - -  Injury with Fall? 0 0 0 - -  Risk for fall due to : - - No Fall Risks - -  Follow up Falls evaluation completed;Falls prevention discussed;Education provided - Falls evaluation completed - -    FALL RISK PREVENTION PERTAINING TO THE HOME:  Any stairs in or around the home? Yes  If so, are there any without handrails? No  Home free of loose throw rugs in walkways, pet beds, electrical cords, etc? Yes  Adequate lighting in your home to reduce risk of falls? Yes   ASSISTIVE DEVICES UTILIZED TO PREVENT FALLS:  Life alert? No  Use of a cane, walker or w/c? No  Grab bars in the bathroom? No  Shower chair or bench in shower? No  Elevated toilet seat or a handicapped toilet? No   TIMED UP AND GO:  Was the test performed? No .    Cognitive Function:  Normal cognitive status assessed by direct observation by this Nurse Health Advisor. No abnormalities found.          Immunizations Immunization History  Administered Date(s) Administered   Influenza, Seasonal, Injecte, Preservative Fre 08/25/2014   Influenza,inj,Quad PF,6+ Mos 09/14/2015, 09/10/2020    Influenza-Unspecified 09/20/1957, 08/21/2013, 09/21/2016, 09/06/2017, 09/23/2018, 08/24/2021   Moderna Sars-Covid-2 Vaccination 02/01/2020, 02/29/2020, 09/28/2020, 04/19/2021   Tdap 08/25/2014   Zoster, Live 09/14/2015    TDAP status: Up to date  Flu Vaccine status: Up to date  Pneumococcal vaccine status: Due, Education has been provided regarding the importance of this vaccine. Advised may receive this vaccine at local pharmacy or Health Dept. Aware to provide a copy of the vaccination record if obtained from local pharmacy or Health Dept. Verbalized acceptance and understanding.  Covid-19 vaccine status: Information provided on how to obtain vaccines.   Qualifies for Shingles Vaccine? Yes   Zostavax completed Yes   Shingrix Completed?: No.    Education has been provided regarding the importance of this vaccine. Patient has been advised to call insurance company to determine out of pocket expense if they have not yet received this vaccine. Advised may also receive vaccine at local pharmacy or Health Dept. Verbalized acceptance and understanding.  Screening Tests Health Maintenance  Topic Date Due   Zoster Vaccines- Shingrix (1 of 2) Never done   COVID-19 Vaccine (5 - Booster for Moderna series) 06/14/2021   Pneumonia Vaccine 63+ Years old (1 - PCV) Never done   MAMMOGRAM  12/07/2021   PAP SMEAR-Modifier  01/26/2024   TETANUS/TDAP  08/25/2024   COLONOSCOPY (Pts 45-81yrs Insurance coverage will need to be confirmed)  05/24/2026   INFLUENZA VACCINE  Completed   DEXA SCAN  Completed   Hepatitis C Screening  Completed   HIV Screening  Completed   HPV VACCINES  Aged Out    Health Maintenance  Health Maintenance Due  Topic Date Due   Zoster Vaccines- Shingrix (1 of 2) Never done   COVID-19 Vaccine (5 - Booster for Moderna series) 06/14/2021   Pneumonia Vaccine 31+ Years old (1 - PCV) Never done   MAMMOGRAM  12/07/2021    Colorectal cancer screening: Type of screening:  Colonoscopy. Completed 2017. Repeat every 10 years  Mammogram scheduled  Bone Density status: Completed 2022. Results reflect: Bone density results: OSTEOPENIA. Repeat every may 2023 years.  Lung Cancer Screening: (Low Dose CT Chest recommended if Age 96-80 years, 30 pack-year currently smoking OR have quit  w/in 15years.) does not qualify.   Lung Cancer Screening Referral:   Additional Screening:  Hepatitis C Screening: does not qualify; Completed 2018  Vision Screening: Recommended annual ophthalmology exams for early detection of glaucoma and other disorders of the eye. Is the patient up to date with their annual eye exam?  No   goes every 3 years wears glasses Who is the provider or what is the name of the office in which the patient attends annual eye exams? [facility] If pt is not established with a provider, would they like to be referred to a provider to establish care? No .   Dental Screening: Recommended annual dental exams for proper oral hygiene  Community Resource Referral / Chronic Care Management: CRR required this visit?  No   CCM required this visit?  No      Plan:     I have personally reviewed and noted the following in the patients chart:   Medical and social history Use of alcohol, tobacco or illicit drugs  Current medications and supplements including opioid prescriptions.  Functional ability and status Nutritional status Physical activity Advanced directives List of other physicians Hospitalizations, surgeries, and ER visits in previous 12 months Vitals Screenings to include cognitive, depression, and falls Referrals and appointments  In addition, I have reviewed and discussed with patient certain preventive protocols, quality metrics, and best practice recommendations. A written personalized care plan for preventive services as well as general preventive health recommendations were provided to patient.     Leroy Kennedy, LPN   5/70/1779   Nurse  Notes:

## 2022-01-18 NOTE — Patient Instructions (Signed)
Jennifer Sanders , Thank you for taking time to come for your Medicare Wellness Visit. I appreciate your ongoing commitment to your health goals. Please review the following plan we discussed and let me know if I can assist you in the future.   Screening recommendations/referrals: Colonoscopy: up to date Mammogram: scheduled  Bone Density: discuss with MD  Recommended yearly ophthalmology/optometry visit for glaucoma screening and checkup Recommended yearly dental visit for hygiene and checkup  Vaccinations: Influenza vaccine: up to date Pneumococcal vaccine: Education provided Tdap vaccine: up  to date Shingles vaccine: Education provided    Advanced directives: yes  Conditions/risks identified:   Next appointment: 03-21-2022 @ 10:00 [facility] 65 Years and Older, Female Preventive care refers to lifestyle choices and visits with your health care provider that can promote health and wellness. What does preventive care include? A yearly physical exam. This is also called an annual well check. Dental exams once or twice a year. Routine eye exams. Ask your health care provider how often you should have your eyes checked. Personal lifestyle choices, including: Daily care of your teeth and gums. Regular physical activity. Eating a healthy diet. Avoiding tobacco and drug use. Limiting alcohol use. Practicing safe sex. Taking low-dose aspirin every day. Taking vitamin and mineral supplements as recommended by your health care provider. What happens during an annual well check? The services and screenings done by your health care provider during your annual well check will depend on your age, overall health, lifestyle risk factors, and family history of disease. Counseling  Your health care provider may ask you questions about your: Alcohol use. Tobacco use. Drug use. Emotional well-being. Home and relationship well-being. Sexual activity. Eating habits. History of  falls. Memory and ability to understand (cognition). Work and work Statistician. Reproductive health. Screening  You may have the following tests or measurements: Height, weight, and BMI. Blood pressure. Lipid and cholesterol levels. These may be checked every 5 years, or more frequently if you are over 27 years old. Skin check. Lung cancer screening. You may have this screening every year starting at age 34 if you have a 30-pack-year history of smoking and currently smoke or have quit within the past 15 years. Fecal occult blood test (FOBT) of the stool. You may have this test every year starting at age 6. Flexible sigmoidoscopy or colonoscopy. You may have a sigmoidoscopy every 5 years or a colonoscopy every 10 years starting at age 44. Hepatitis C blood test. Hepatitis B blood test. Sexually transmitted disease (STD) testing. Diabetes screening. This is done by checking your blood sugar (glucose) after you have not eaten for a while (fasting). You may have this done every 1-3 years. Bone density scan. This is done to screen for osteoporosis. You may have this done starting at age 83. Mammogram. This may be done every 1-2 years. Talk to your health care provider about how often you should have regular mammograms. Talk with your health care provider about your test results, treatment options, and if necessary, the need for more tests. Vaccines  Your health care provider may recommend certain vaccines, such as: Influenza vaccine. This is recommended every year. Tetanus, diphtheria, and acellular pertussis (Tdap, Td) vaccine. You may need a Td booster every 10 years. Zoster vaccine. You may need this after age 25. Pneumococcal 13-valent conjugate (PCV13) vaccine. One dose is recommended after age 60. Pneumococcal polysaccharide (PPSV23) vaccine. One dose is recommended after age 90. Talk to your health care provider about  which screenings and vaccines you need and how often you need  them. This information is not intended to replace advice given to you by your health care provider. Make sure you discuss any questions you have with your health care provider. Document Released: 12/11/2015 Document Revised: 08/03/2016 Document Reviewed: 09/15/2015 Elsevier Interactive Patient Education  2017 [place] Prevention in the Home Falls can cause injuries. They can happen to people of all ages. There are many things you can do to make your home safe and to help prevent falls. What can I do on the outside of my home? Regularly fix the edges of walkways and driveways and fix any cracks. Remove anything that might make you trip as you walk through a door, such as a raised step or threshold. Trim any bushes or trees on the path to your home. Use bright outdoor lighting. Clear any walking paths of anything that might make someone trip, such as rocks or tools. Regularly check to see if handrails are loose or broken. Make sure that both sides of any steps have handrails. Any raised decks and porches should have guardrails on the edges. Have any leaves, snow, or ice cleared regularly. Use sand or salt on walking paths during winter. Clean up any spills in your garage right away. This includes oil or grease spills. What can I do in the bathroom? Use night lights. Install grab bars by the toilet and in the tub and shower. Do not use towel bars as grab bars. Use non-skid mats or decals in the tub or shower. If you need to sit down in the shower, use a plastic, non-slip stool. Keep the floor dry. Clean up any water that spills on the floor as soon as it happens. Remove soap buildup in the tub or shower regularly. Attach bath mats securely with double-sided non-slip rug tape. Do not have throw rugs and other things on the floor that can make you trip. What can I do in the bedroom? Use night lights. Make sure that you have a light by your bed that is easy to reach. Do not use  any sheets or blankets that are too big for your bed. They should not hang down onto the floor. Have a firm chair that has side arms. You can use this for support while you get dressed. Do not have throw rugs and other things on the floor that can make you trip. What can I do in the kitchen? Clean up any spills right away. Avoid walking on wet floors. Keep items that you use a lot in easy-to-reach places. If you need to reach something above you, use a strong step stool that has a grab bar. Keep electrical cords out of the way. Do not use floor polish or wax that makes floors slippery. If you must use wax, use non-skid floor wax. Do not have throw rugs and other things on the floor that can make you trip. What can I do with my stairs? Do not leave any items on the stairs. Make sure that there are handrails on both sides of the stairs and use them. Fix handrails that are broken or loose. Make sure that handrails are as long as the stairways. Check any carpeting to make sure that it is firmly attached to the stairs. Fix any carpet that is loose or worn. Avoid having throw rugs at the top or bottom of the stairs. If you do have throw rugs, attach them to the floor with  carpet tape. Make sure that you have a light switch at the top of the stairs and the bottom of the stairs. If you do not have them, ask someone to add them for you. What else can I do to help prevent falls? Wear shoes that: Do not have high heels. Have rubber bottoms. Are comfortable and fit you well. Are closed at the toe. Do not wear sandals. If you use a stepladder: Make sure that it is fully opened. Do not climb a closed stepladder. Make sure that both sides of the stepladder are locked into place. Ask someone to hold it for you, if possible. Clearly mark and make sure that you can see: Any grab bars or handrails. First and last steps. Where the edge of each step is. Use tools that help you move around (mobility aids)  if they are needed. These include: Canes. Walkers. Scooters. Crutches. Turn on the lights when you go into a dark area. Replace any light bulbs as soon as they burn out. Set up your furniture so you have a clear path. Avoid moving your furniture around. If any of your floors are uneven, fix them. If there are any pets around you, be aware of where they are. Review your medicines with your doctor. Some medicines can make you feel dizzy. This can increase your chance of falling. Ask your doctor what other things that you can do to help prevent falls. This information is not intended to replace advice given to you by your health care provider. Make sure you discuss any questions you have with your health care provider. Document Released: 09/10/2009 Document Revised: 04/21/2016 Document Reviewed: 12/19/2014 Elsevier Interactive Patient Education  2017 [employer].

## 2022-01-24 ENCOUNTER — Other Ambulatory Visit: Payer: Self-pay

## 2022-01-24 ENCOUNTER — Encounter: Payer: Self-pay | Admitting: Nurse Practitioner

## 2022-01-24 ENCOUNTER — Inpatient Hospital Stay: Payer: Medicare HMO | Attending: Oncology | Admitting: Nurse Practitioner

## 2022-01-24 VITALS — BP 124/79 | HR 75 | Temp 97.8°F | Wt 193.0 lb

## 2022-01-24 DIAGNOSIS — Z853 Personal history of malignant neoplasm of breast: Secondary | ICD-10-CM | POA: Diagnosis not present

## 2022-01-24 DIAGNOSIS — Z5181 Encounter for therapeutic drug level monitoring: Secondary | ICD-10-CM | POA: Diagnosis not present

## 2022-01-24 DIAGNOSIS — M8580 Other specified disorders of bone density and structure, unspecified site: Secondary | ICD-10-CM | POA: Diagnosis not present

## 2022-01-24 DIAGNOSIS — C50412 Malignant neoplasm of upper-outer quadrant of left female breast: Secondary | ICD-10-CM | POA: Insufficient documentation

## 2022-01-24 DIAGNOSIS — Z08 Encounter for follow-up examination after completed treatment for malignant neoplasm: Secondary | ICD-10-CM

## 2022-01-24 DIAGNOSIS — Z79899 Other long term (current) drug therapy: Secondary | ICD-10-CM | POA: Diagnosis not present

## 2022-01-24 DIAGNOSIS — I10 Essential (primary) hypertension: Secondary | ICD-10-CM | POA: Insufficient documentation

## 2022-01-24 DIAGNOSIS — Z79811 Long term (current) use of aromatase inhibitors: Secondary | ICD-10-CM | POA: Diagnosis not present

## 2022-01-24 DIAGNOSIS — Z170 Estrogen receptor positive status [ER+]: Secondary | ICD-10-CM | POA: Diagnosis not present

## 2022-01-24 DIAGNOSIS — Z923 Personal history of irradiation: Secondary | ICD-10-CM | POA: Insufficient documentation

## 2022-01-24 DIAGNOSIS — G5712 Meralgia paresthetica, left lower limb: Secondary | ICD-10-CM | POA: Insufficient documentation

## 2022-01-24 NOTE — Progress Notes (Signed)
[facility]  [phone] [phone]) [phone]  ID: Jennifer Sanders OB: 10-Mar-1956  MR#: [id number]  NIO#:[id number]  Patient Care Team: Valerie Roys, DO as PCP - General (Family Medicine) Noreene Filbert, MD as Consulting Physician (Radiation Oncology) Lloyd Huger, MD as Consulting Physician (Oncology) Ronny Bacon, MD as Consulting Physician (General Surgery)  CHIEF COMPLAINT: Stage Ia ER/PR positive, HER-2 negative invasive carcinoma of the upper outer quadrant left breast.  Oncotype DX score low risk 8.  INTERVAL HISTORY: Patient returns to clinic today for routine 19-monthevaluation and follow up for letrozole.  She currently feels well and is asymptomatic.  She is tolerating letrozole without significant side effects. She has no neurologic complaints.  She denies any recent fevers or illnesses.  She has a good appetite and denies weight loss.  She has no chest pain, shortness of breath, cough, or hemoptysis.  She denies any nausea, vomiting, constipation, or diarrhea.  She has no urinary complaints.  Patient offers no specific complaints today.  REVIEW OF SYSTEMS:   Review of Systems  Constitutional: Negative.  Negative for fever and malaise/fatigue.  Respiratory: Negative.  Negative for cough, hemoptysis and shortness of breath.   Cardiovascular: Negative.  Negative for chest pain and leg swelling.  Gastrointestinal: Negative.  Negative for abdominal pain.  Genitourinary: Negative.  Negative for dysuria.  Musculoskeletal: Negative.  Negative for back pain.  Skin: Negative.  Negative for rash.  Neurological: Negative.  Negative for dizziness, focal weakness, weakness and headaches.  Psychiatric/Behavioral: Negative.  The patient is not nervous/anxious.   As per HPI. Otherwise, a complete review of systems is negative.  PAST MEDICAL HISTORY: Past Medical History:  Diagnosis Date   Bilateral carpal tunnel syndrome    Diverticulosis     found on colonoscopy on 05/24/16   Hypertension    Meralgia paraesthetica, left     PAST SURGICAL HISTORY: Past Surgical History:  Procedure Laterality Date   BREAST BIOPSY Left 12/18/2020   [facility]  BREAST LUMPECTOMY,RADIO FREQ LOCALIZER,AXILLARY SENTINEL LYMPH NODE BIOPSY Left 01/01/2021   Procedure: BREAST LUMPECTOMY,RADIO FREQ LOCALIZER,AXILLARY SENTINEL LYMPH NODE BIOPSY;  Surgeon: RRonny Bacon MD;  Location: [facility] ORS;  Service: General;  Laterality: Left;  Left axilla   CHOLECYSTECTOMY  1990s   COLONOSCOPY  05/24/2016   internal hemorrhoids, diverticulosis    FAMILY HISTORY: Family History  Problem Relation Age of Onset   Hyperlipidemia Mother    Cancer Mother        lung   Cancer Father        esophageal   Throat cancer Maternal Grandmother    Breast cancer Neg Hx     ADVANCED DIRECTIVES (Y/N):  N  HEALTH MAINTENANCE: Social History   Tobacco Use   Smoking status: Never   Smokeless tobacco: Never  Vaping Use   Vaping Use: Never used  Substance Use Topics   Alcohol use: No   Drug use: No     Colonoscopy:  PAP:  Bone density:  Lipid panel:  No Known Allergies  Current Outpatient Medications  Medication Sig Dispense Refill   letrozole (FEMARA) 2.5 MG tablet Take 1 tablet (2.5 mg total) by mouth daily. 90 tablet 3   lisinopril-hydrochlorothiazide (ZESTORETIC) 20-12.5 MG tablet Take 1 tablet by mouth daily. 90 tablet 1   pyridoxine (B-6) 200 MG tablet Take 200 mg by mouth daily.     No current facility-administered medications for this visit.    OBJECTIVE: Vitals:   01/24/22 1030  BP:  124/79  Pulse: 75  Temp: 97.8 F (36.6 C)     Body mass index is 36.47 kg/m.    ECOG FS:0 - Asymptomatic  General: Well-developed, well-nourished, no acute distress. Eyes: Pink conjunctiva, anicteric sclera. Lungs: Clear to auscultation bilaterally.  No audible wheezing or coughing Heart: Regular rate and rhythm.  Abdomen: Soft, nontender, nondistended.   Musculoskeletal: No edema, cyanosis, or clubbing. Neuro: Alert, answering all questions appropriately. Cranial nerves grossly intact. Skin: No rashes or petechiae noted. Psych: Normal affect.   LAB RESULTS:  Lab Results  Component Value Date   NA 139 09/17/2021   K 4.1 09/17/2021   CL 100 09/17/2021   CO2 24 09/17/2021   GLUCOSE 97 09/17/2021   BUN 9 09/17/2021   CREATININE 0.85 09/17/2021   CALCIUM 9.9 09/17/2021   PROT 7.2 03/18/2021   ALBUMIN 4.2 03/18/2021   AST 21 03/18/2021   ALT 16 03/18/2021   ALKPHOS 75 03/18/2021   BILITOT 0.5 03/18/2021   GFRNONAA >60 12/24/2020   GFRAA 77 09/16/2020    Lab Results  Component Value Date   WBC 7.0 03/23/2021   NEUTROABS 5.0 03/18/2021   HGB 13.8 03/23/2021   HCT 41.4 03/23/2021   MCV 83.3 03/23/2021   PLT 213 03/23/2021     STUDIES: No results found.  ASSESSMENT: Stage Ia ER/PR positive, HER-2 negative invasive carcinoma of the upper outer quadrant left breast.  Oncotype DX score low risk 8.  PLAN:    1. Stage Ia ER/PR positive, HER-2 negative invasive carcinoma of the upper outer quadrant left breast: Oncotype DX score low risk 8.  Given patient's stage of disease and low risk Oncotype, she did not require adjuvant chemotherapy.  Patient completed XRT on Apr 12, 2021.  Continue letrozole for a total of 5 years completing treatment in May 2027. Mammograms scheduled by PCP. Return to clinic in 6 months for routine evaluation.   2.  Osteopenia: Patient's bone density on Apr 19, 2021 revealed a T score of -1.9.  Recommended calcium and vitamin D supplementation.  Repeat in May 2023.    Disposition: Bone density in May 2023 6 mo- follow up with Dr. Grayland Ormond (ok for virtual appointments)  Patient expressed understanding and was in agreement with this plan. She also understands that She can call clinic at any time with any questions, concerns, or complaints.    Cancer Staging  Malignant neoplasm of upper-outer quadrant  of left female breast [facility]) Staging form: Breast, AJCC 8th Edition - Pathologic stage from 01/22/2021: Stage IA (pT1c, pN0, cM0, G1, ER+, PR+, HER2-, Oncotype DX score: 8) - Signed by Lloyd Huger, MD on 01/22/2021 Stage prefix: Initial diagnosis Multigene prognostic tests performed: Oncotype DX Recurrence score range: Less than 11 Histologic grading system: 3 grade system   Verlon Au, NP   01/24/2022

## 2022-02-04 ENCOUNTER — Ambulatory Visit
Admission: RE | Admit: 2022-02-04 | Discharge: 2022-02-04 | Disposition: A | Discharge status: Home / Self Care | Payer: Medicare HMO | Source: Ambulatory Visit | Attending: Family Medicine | Admitting: Family Medicine

## 2022-02-04 ENCOUNTER — Other Ambulatory Visit: Payer: Self-pay

## 2022-02-04 DIAGNOSIS — C50412 Malignant neoplasm of upper-outer quadrant of left female breast: Secondary | ICD-10-CM | POA: Diagnosis not present

## 2022-02-04 DIAGNOSIS — R928 Other abnormal and inconclusive findings on diagnostic imaging of breast: Secondary | ICD-10-CM | POA: Diagnosis not present

## 2022-02-04 HISTORY — DX: Malignant neoplasm of unspecified site of unspecified female breast: C50.919

## 2022-02-04 HISTORY — DX: Personal history of irradiation: Z92.3

## 2022-03-21 ENCOUNTER — Ambulatory Visit (INDEPENDENT_AMBULATORY_CARE_PROVIDER_SITE_OTHER): Payer: Medicare HMO | Admitting: Family Medicine

## 2022-03-21 ENCOUNTER — Encounter: Payer: Self-pay | Admitting: Family Medicine

## 2022-03-21 VITALS — BP 118/74 | HR 72 | Temp 98.4°F | Ht 60.0 in | Wt 193.4 lb

## 2022-03-21 DIAGNOSIS — Z170 Estrogen receptor positive status [ER+]: Secondary | ICD-10-CM

## 2022-03-21 DIAGNOSIS — Z131 Encounter for screening for diabetes mellitus: Secondary | ICD-10-CM | POA: Diagnosis not present

## 2022-03-21 DIAGNOSIS — Z136 Encounter for screening for cardiovascular disorders: Secondary | ICD-10-CM | POA: Diagnosis not present

## 2022-03-21 DIAGNOSIS — Z7189 Other specified counseling: Secondary | ICD-10-CM | POA: Diagnosis not present

## 2022-03-21 DIAGNOSIS — D692 Other nonthrombocytopenic purpura: Secondary | ICD-10-CM | POA: Diagnosis not present

## 2022-03-21 DIAGNOSIS — Z23 Encounter for immunization: Secondary | ICD-10-CM | POA: Diagnosis not present

## 2022-03-21 DIAGNOSIS — Z0000 Encounter for general adult medical examination without abnormal findings: Secondary | ICD-10-CM

## 2022-03-21 DIAGNOSIS — C50412 Malignant neoplasm of upper-outer quadrant of left female breast: Secondary | ICD-10-CM

## 2022-03-21 DIAGNOSIS — I10 Essential (primary) hypertension: Secondary | ICD-10-CM | POA: Diagnosis not present

## 2022-03-21 LAB — MICROALBUMIN, URINE WAIVED
Creatinine, Urine Waived: 50 mg/dL (ref 10–300)
Microalb, Ur Waived: 10 mg/L (ref 0–19)
Microalb/Creat Ratio: <30 mg/g (ref <30)

## 2022-03-21 LAB — URINALYSIS, ROUTINE W REFLEX MICROSCOPIC
Bilirubin, UA: NEGATIVE
Glucose, UA: NEGATIVE
Ketones, UA: NEGATIVE
Leukocytes,UA: NEGATIVE
Nitrite, UA: NEGATIVE
Protein,UA: NEGATIVE
RBC, UA: NEGATIVE
Specific Gravity, UA: 1.015 (ref 1.005–1.030)
Urobilinogen, Ur: 0.2 mg/dL (ref 0.2–1.0)
pH, UA: 6 (ref 5.0–7.5)

## 2022-03-21 LAB — BAYER DCA HB A1C WAIVED: HB A1C (BAYER DCA - WAIVED): 5.4 % (ref 4.8–5.6)

## 2022-03-21 MED ORDER — LISINOPRIL-HYDROCHLOROTHIAZIDE 20-12.5 MG PO TABS: 1.0000 | ORAL_TABLET | Freq: Every day | ORAL | 1 refills | Status: DC

## 2022-03-21 NOTE — Assessment & Plan Note (Addendum)
Stable. Continue to follow with oncology. Continue to monitor. Call with any concerns. Continue to monitor.  ?

## 2022-03-21 NOTE — Assessment & Plan Note (Signed)
Stable. Reassured patient. Call with any concerns. ?

## 2022-03-21 NOTE — Patient Instructions (Signed)
Preventative Services:  ?AAA screening: N/A ?Health Risk Assessment and Personalized Prevention Plan: Done today ?Bone Mass Measurements: Scheduled for May ?Breast Cancer Screening: Up to date ?CVD Screening: Done today ?Cervical Cancer Screening: N/A ?Colon Cancer Screening: up to date ?Depression Screening: Done today ?Diabetes Screening: Done today ?Glaucoma Screening: See your eye doctor ?Hepatitis B vaccine: N/A ?Hepatitis C screening: Up to date ?HIV Screening: Up to date ?Flu Vaccine: Up to date ?Lung cancer Screening: N/A ?Obesity Screening: Done today ?Pneumonia Vaccines: Given today ?STI Screening: N/A ?

## 2022-03-21 NOTE — Progress Notes (Signed)
Interpreted by me today. NSR at 71bpm, no ST segment changes.

## 2022-03-21 NOTE — Assessment & Plan Note (Signed)
A voluntary discussion about advance care planning including the explanation and discussion of advance directives was extensively discussed  with the patient for 3 minutes with patient present.  Explanation about the health care proxy and Living will was reviewed and packet with forms with explanation of how to fill them out was given.  During this discussion, the patient was not able to identify a health care proxy and plans to fill out the paperwork required.  Patient was offered a separate Advance Care Planning visit for further assistance with forms.    

## 2022-03-21 NOTE — Assessment & Plan Note (Signed)
Under good control on current regimen. Continue current regimen. Continue to monitor. Call with any concerns. Refills given. Labs drawn today.   

## 2022-03-21 NOTE — Progress Notes (Signed)
? ?BP 118/74   Pulse 72   Temp 98.4 ?F (36.9 ?C)   Ht 5' (1.524 m)   Wt 193 lb 6.4 oz (87.7 kg)   SpO2 95%   BMI 37.77 kg/m?   ? ?Subjective:  ? ? Patient ID: Jennifer Sanders, female    DOB: 09-24-1956, 66 y.o.   MRN: [id number] ? ?HPI: ?Jennifer Sanders is a 66 y.o. female presenting on 03/21/2022 for comprehensive medical examination. Current medical complaints include: ? ?HYPERTENSION ?Hypertension status: controlled  ?Satisfied with current treatment? yes ?Duration of hypertension: chronic ?BP medication side effects:  no ?Medication compliance: excellent compliance ?Previous BP meds: lisinopril-HCTZ ?Aspirin: no ?Recurrent headaches: no ?Visual changes: no ?Palpitations: no ?Dyspnea: no ?Chest pain: no ?Lower extremity edema: no ?Dizzy/lightheaded: no ? ?She currently lives with: husband ?Menopausal Symptoms: no ? ?Functional Status Survey: ?Is the patient deaf or have difficulty hearing?: No ?Does the patient have difficulty seeing, even when wearing glasses/contacts?: No ?Does the patient have difficulty concentrating, remembering, or making decisions?: No ?Does the patient have difficulty walking or climbing stairs?: No ?Does the patient have difficulty dressing or bathing?: No ?Does the patient have difficulty doing errands alone such as visiting a doctor's office or shopping?: No ? ? ?  03/21/2022  ? 10:19 AM 01/18/2022  ? 11:07 AM 09/17/2021  ? 10:04 AM 03/18/2021  ? 10:06 AM 02/18/2021  ? 10:02 AM  ?Fall Risk   ?Falls in the past year? 0 0 0 0 0  ?Number falls in past yr: 0 0 0 0   ?Injury with Fall? 0 0 0 0   ?Risk for fall due to : No Fall Risks   No Fall Risks   ?Follow up Falls evaluation completed Falls evaluation completed;Falls prevention discussed;Education provided  Falls evaluation completed   ? ? ?Depression Screen ? ?  03/21/2022  ? 10:36 AM 01/18/2022  ? 11:06 AM 01/18/2022  ? 10:36 AM 09/17/2021  ? 10:04 AM 03/18/2021  ? 10:07 AM  ?Depression screen PHQ 2/9  ?Decreased Interest 0 0 0 0 0   ?Down, Depressed, Hopeless 0 0 0 0 0  ?PHQ - 2 Score 0 0 0 0 0  ?Altered sleeping 0   0   ?Tired, decreased energy 0   0   ?Change in appetite 0   0   ?Feeling bad or failure about yourself  0   0   ?Trouble concentrating 0   0   ?Moving slowly or fidgety/restless 0   0   ?Suicidal thoughts 0   0   ?PHQ-9 Score 0   0   ?Difficult doing work/chores    Not difficult at all   ? ?Advanced Directives ?Does patient have a HCPOA?    no ?If yes, name and contact information:  ?Does patient have a living will or MOST form?  no ? ?Past Medical History:  ?Past Medical History:  ?Diagnosis Date  ? Bilateral carpal tunnel syndrome   ? Breast cancer ([place])   ? left breast  ? Diverticulosis   ? found on colonoscopy on 05/24/16  ? Hypertension   ? Meralgia paraesthetica, left   ? Personal history of radiation therapy   ? ? ?Surgical History:  ?Past Surgical History:  ?Procedure Laterality Date  ? BREAST BIOPSY Left 12/18/2020  ? [place]  ? BREAST LUMPECTOMY Left 01/01/2021  ? BREAST LUMPECTOMY,RADIO FREQ LOCALIZER,AXILLARY SENTINEL LYMPH NODE BIOPSY Left 01/01/2021  ? Procedure: BREAST LUMPECTOMY,RADIO FREQ LOCALIZER,AXILLARY SENTINEL LYMPH NODE BIOPSY;  Surgeon: Ronny Bacon, MD;  Location: [facility] ORS;  Service: General;  Laterality: Left;  Left axilla  ? CHOLECYSTECTOMY  1990s  ? COLONOSCOPY  05/24/2016  ? internal hemorrhoids, diverticulosis  ? DENTAL SURGERY  2023  ? ? ?Medications:  ?Current Outpatient Medications on File Prior to Visit  ?Medication Sig  ? letrozole (FEMARA) 2.5 MG tablet Take 1 tablet (2.5 mg total) by mouth daily.  ? pyridoxine (B-6) 200 MG tablet Take 200 mg by mouth daily.  ? ?No current facility-administered medications on file prior to visit.  ? ? ?Allergies:  ?No Known Allergies ? ?Social History:  ?Social History  ? ?Socioeconomic History  ? Marital status: Married  ?  Spouse name: Not on file  ? Number of children: Not on file  ? Years of education: Not on file  ? Highest education level: Not on file   ?Occupational History  ? Not on file  ?Tobacco Use  ? Smoking status: Never  ? Smokeless tobacco: Never  ?Vaping Use  ? Vaping Use: Never used  ?Substance and Sexual Activity  ? Alcohol use: No  ? Drug use: No  ? Sexual activity: Yes  ?Other Topics Concern  ? Not on file  ?Social History Narrative  ? Not on file  ? ?Social Determinants of Health  ? ?Financial Resource Strain: Low Risk   ? Difficulty of Paying Living Expenses: Not hard at all  ?Food Insecurity: No Food Insecurity  ? Worried About Charity fundraiser in the Last Year: Never true  ? Ran Out of Food in the Last Year: Never true  ?Transportation Needs: No Transportation Needs  ? Lack of Transportation (Medical): No  ? Lack of Transportation (Non-Medical): No  ?Physical Activity: Insufficiently Active  ? Days of Exercise per Week: 1 day  ? Minutes of Exercise per Session: 10 min  ?Stress: No Stress Concern Present  ? Feeling of Stress : Not at all  ?Social Connections: Moderately Isolated  ? Frequency of Communication with Friends and Family: More than three times a week  ? Frequency of Social Gatherings with Friends and Family: Twice a week  ? Attends Religious Services: Never  ? Active Member of Clubs or Organizations: No  ? Attends Archivist Meetings: Never  ? Marital Status: Married  ?Intimate Partner Violence: Not At Risk  ? Fear of Current or Ex-Partner: No  ? Emotionally Abused: No  ? Physically Abused: No  ? Sexually Abused: No  ? ?Social History  ? ?Tobacco Use  ?Smoking Status Never  ?Smokeless Tobacco Never  ? ?Social History  ? ?Substance and Sexual Activity  ?Alcohol Use No  ? ? ?Family History:  ?Family History  ?Problem Relation Age of Onset  ? Hyperlipidemia Mother   ? Cancer Mother   ?     lung  ? Cancer Father   ?     esophageal  ? Throat cancer Maternal Grandmother   ? Breast cancer Neg Hx   ? ? ?Past medical history, surgical history, medications, allergies, family history and social history reviewed with patient today and  changes made to appropriate areas of the chart.  ? ?Review of Systems  ?Constitutional: Negative.   ?HENT: Negative.    ?Eyes: Negative.   ?Respiratory:  Positive for cough. Negative for hemoptysis, sputum production, shortness of breath and wheezing.   ?Cardiovascular: Negative.   ?Gastrointestinal: Negative.   ?Genitourinary: Negative.   ?Musculoskeletal: Negative.   ?Skin: Negative.   ?Neurological: Negative.   ?  Endo/Heme/Allergies:  Positive for environmental allergies. Negative for polydipsia. Bruises/bleeds easily.  ?Psychiatric/Behavioral: Negative.    ? ?All other ROS negative except what is listed above and in the HPI.  ? ?   ?Objective:  ?  ?BP 118/74   Pulse 72   Temp 98.4 ?F (36.9 ?C)   Ht 5' (1.524 m)   Wt 193 lb 6.4 oz (87.7 kg)   SpO2 95%   BMI 37.77 kg/m?   ?Wt Readings from Last 3 Encounters:  ?03/21/22 193 lb 6.4 oz (87.7 kg)  ?01/24/22 193 lb (87.5 kg)  ?10/13/21 194 lb (88 kg)  ?  ?Hearing Screening  ? '500Hz'$  '1000Hz'$  '2000Hz'$  '4000Hz'$   ?Right ear Pass Pass Pass Pass  ?Left ear Pass Pass Pass Pass  ? ?Vision Screening  ? Right eye Left eye Both eyes  ?Without correction     ?With correction '20/20 20/20 20/20 '$  ? ? ?Physical Exam ?Vitals and nursing note reviewed.  ?Constitutional:   ?   General: She is not in acute distress. ?   Appearance: Normal appearance. She is not ill-appearing, toxic-appearing or diaphoretic.  ?HENT:  ?   Head: Normocephalic and atraumatic.  ?   Right Ear: Tympanic membrane, ear canal and external ear normal. There is no impacted cerumen.  ?   Left Ear: Tympanic membrane, ear canal and external ear normal. There is no impacted cerumen.  ?   Nose: Nose normal. No congestion or rhinorrhea.  ?   Mouth/Throat:  ?   Mouth: Mucous membranes are moist.  ?   Pharynx: Oropharynx is clear. No oropharyngeal exudate or posterior oropharyngeal erythema.  ?Eyes:  ?   General: No scleral icterus.    ?   Right eye: No discharge.     ?   Left eye: No discharge.  ?   Extraocular Movements:  Extraocular movements intact.  ?   Conjunctiva/sclera: Conjunctivae normal.  ?   Pupils: Pupils are equal, round, and reactive to light.  ?Neck:  ?   Vascular: No carotid bruit.  ?Cardiovascular:  ?   Rate and Rhythm: Constance Holster

## 2022-03-22 LAB — CBC WITH DIFFERENTIAL/PLATELET
Basophils Absolute: 0 10*3/uL (ref 0.0–0.2)
Basos: 0 %
EOS (ABSOLUTE): 0.1 10*3/uL (ref 0.0–0.4)
Eos: 1 %
Hematocrit: 46 % (ref 34.0–46.6)
Hemoglobin: 15.6 g/dL (ref 11.1–15.9)
Immature Grans (Abs): 0 10*3/uL (ref 0.0–0.1)
Immature Granulocytes: 0 %
Lymphocytes Absolute: 1.3 10*3/uL (ref 0.7–3.1)
Lymphs: 14 %
MCH: 28.6 pg (ref 26.6–33.0)
MCHC: 33.9 g/dL (ref 31.5–35.7)
MCV: 84 fL (ref 79–97)
Monocytes Absolute: 0.6 10*3/uL (ref 0.1–0.9)
Monocytes: 7 %
Neutrophils Absolute: 7.3 10*3/uL — ABNORMAL HIGH (ref 1.4–7.0)
Neutrophils: 78 %
Platelets: 248 10*3/uL (ref 150–450)
RBC: 5.45 x10E6/uL — ABNORMAL HIGH (ref 3.77–5.28)
RDW: 13.6 % (ref 11.7–15.4)
WBC: 9.4 10*3/uL (ref 3.4–10.8)

## 2022-03-22 LAB — COMPREHENSIVE METABOLIC PANEL
ALT: 18 IU/L (ref 0–32)
AST: 25 IU/L (ref 0–40)
Albumin/Globulin Ratio: 1.6 (ref 1.2–2.2)
Albumin: 4.7 g/dL (ref 3.8–4.8)
Alkaline Phosphatase: 93 IU/L (ref 44–121)
BUN/Creatinine Ratio: 8 — ABNORMAL LOW (ref 12–28)
BUN: 7 mg/dL — ABNORMAL LOW (ref 8–27)
Bilirubin Total: 0.6 mg/dL (ref 0.0–1.2)
CO2: 25 mmol/L (ref 20–29)
Calcium: 10.3 mg/dL (ref 8.7–10.3)
Chloride: 100 mmol/L (ref 96–106)
Creatinine, Ser: 0.85 mg/dL (ref 0.57–1.00)
Globulin, Total: 3 g/dL (ref 1.5–4.5)
Glucose: 97 mg/dL (ref 70–99)
Potassium: 3.9 mmol/L (ref 3.5–5.2)
Sodium: 141 mmol/L (ref 134–144)
Total Protein: 7.7 g/dL (ref 6.0–8.5)
eGFR: 76 mL/min/{1.73_m2} (ref >59)

## 2022-03-22 LAB — LIPID PANEL W/O CHOL/HDL RATIO
Cholesterol, Total: 207 mg/dL — ABNORMAL HIGH (ref 100–199)
HDL: 67 mg/dL (ref >39)
LDL Chol Calc (NIH): 115 mg/dL — ABNORMAL HIGH (ref 0–99)
Triglycerides: 145 mg/dL (ref 0–149)
VLDL Cholesterol Cal: 25 mg/dL (ref 5–40)

## 2022-03-22 LAB — TSH: TSH: 2.76 u[IU]/mL (ref 0.450–4.500)

## 2022-04-13 ENCOUNTER — Ambulatory Visit
Admission: RE | Admit: 2022-04-13 | Discharge: 2022-04-13 | Disposition: A | Discharge status: Home / Self Care | Payer: Medicare HMO | Source: Ambulatory Visit | Attending: Radiation Oncology | Admitting: Radiation Oncology

## 2022-04-13 VITALS — BP 140/90 | HR 84 | Temp 98.6°F | Resp 16 | Ht 60.0 in | Wt 192.6 lb

## 2022-04-13 DIAGNOSIS — C50412 Malignant neoplasm of upper-outer quadrant of left female breast: Secondary | ICD-10-CM | POA: Diagnosis not present

## 2022-04-13 DIAGNOSIS — Z170 Estrogen receptor positive status [ER+]: Secondary | ICD-10-CM | POA: Diagnosis not present

## 2022-04-13 DIAGNOSIS — Z923 Personal history of irradiation: Secondary | ICD-10-CM | POA: Insufficient documentation

## 2022-04-13 DIAGNOSIS — Z79811 Long term (current) use of aromatase inhibitors: Secondary | ICD-10-CM | POA: Insufficient documentation

## 2022-04-13 DIAGNOSIS — Z08 Encounter for follow-up examination after completed treatment for malignant neoplasm: Secondary | ICD-10-CM | POA: Diagnosis not present

## 2022-04-13 NOTE — Progress Notes (Signed)
Survivorship Care Plan visit completed.  Treatment summary reviewed and given to patient.  ASCO answers booklet reviewed and given to patient.  CARE program and Cancer Transitions discussed with patient along with other resources cancer center offers to patients and caregivers.  Patient verbalized understanding.    

## 2022-04-13 NOTE — Progress Notes (Signed)
Radiation Oncology ?Follow up Note ? ?Name: Jennifer Sanders   ?Date:   04/13/2022 ?MRN:  [id number] ?DOB: 07-Nov-1956  ? ? ?This 66 y.o. female presents to the clinic today for 1 year follow-up status post whole breast radiation to her left breast for ER/PR positive invasive mammary carcinoma. ? ?REFERRING PROVIDER: Valerie Roys, DO ? ?HPI: Patient is a 66 year old female now out 1 year having completed whole breast radiation to her left breast for stage Ia ER/PR positive invasive mammary carcinoma.  Seen today in routine follow-up she is doing well specifically denies breast tenderness cough or bone pain.  She had mammograms back in March which I have reviewed were BI-RADS 2 benign.  She is currently on Femara tolerating it well without side effect. ? ?COMPLICATIONS OF TREATMENT: none ? ?FOLLOW UP COMPLIANCE: keeps appointments  ? ?PHYSICAL EXAM:  ?BP 140/90 (BP Location: Right Arm, Patient Position: Sitting, Cuff Size: Large)   Pulse 84   Temp 98.6 ?F (37 ?C) (Tympanic)   Resp 16   Ht 5' (1.524 m) Comment: stated wt  Wt 192 lb 9.6 oz (87.4 kg)   BMI 37.61 kg/m?  ?Lungs are clear to A&P cardiac examination essentially unremarkable with regular rate and rhythm. No dominant mass or nodularity is noted in either breast in 2 positions examined. Incision is well-healed. No axillary or supraclavicular adenopathy is appreciated. Cosmetic result is excellent.  Well-developed well-nourished patient in NAD. HEENT reveals PERLA, EOMI, discs not visualized.  Oral cavity is clear. No oral mucosal lesions are identified. Neck is clear without evidence of cervical or supraclavicular adenopathy. Lungs are clear to A&P. Cardiac examination is essentially unremarkable with regular rate and rhythm without murmur rub or thrill. Abdomen is benign with no organomegaly or masses noted. Motor sensory and DTR levels are equal and symmetric in the upper and lower extremities. Cranial nerves II through XII are grossly intact.  Proprioception is intact. No peripheral adenopathy or edema is identified. No motor or sensory levels are noted. Crude visual fields are within normal range. ? ?RADIOLOGY RESULTS: Mammograms reviewed compatible with above-stated findings ? ?PLAN: Present time patient is now 1 year out doing well with no evidence of disease.  I am pleased with her overall progress.  Of asked to see her back in 1 year for follow-up.  Patient knows to call with any concerns.  She continues on Femara without side effect. ? ?I would like to take this opportunity to thank you for allowing me to participate in the care of your patient.. ?  ? Noreene Filbert, MD ? ?

## 2022-04-20 ENCOUNTER — Ambulatory Visit
Admission: RE | Admit: 2022-04-20 | Discharge: 2022-04-20 | Disposition: A | Discharge status: Home / Self Care | Payer: Medicare HMO | Source: Ambulatory Visit | Attending: Nurse Practitioner | Admitting: Nurse Practitioner

## 2022-04-20 DIAGNOSIS — Z5181 Encounter for therapeutic drug level monitoring: Secondary | ICD-10-CM | POA: Insufficient documentation

## 2022-04-20 DIAGNOSIS — M8589 Other specified disorders of bone density and structure, multiple sites: Secondary | ICD-10-CM | POA: Diagnosis not present

## 2022-04-20 DIAGNOSIS — Z79811 Long term (current) use of aromatase inhibitors: Secondary | ICD-10-CM | POA: Diagnosis not present

## 2022-04-20 DIAGNOSIS — Z780 Asymptomatic menopausal state: Secondary | ICD-10-CM | POA: Diagnosis not present

## 2022-06-28 IMAGING — MG DIGITAL DIAGNOSTIC BILAT W/ TOMO W/ CAD
6 of 9 series · 6 of 25 positions shown · non-contrast
Comparison: Previous exam(s).

CLINICAL DATA: 65-year-old female status post malignant left
lumpectomy with radiation therapy in December 2020.

EXAM:
DIGITAL DIAGNOSTIC BILATERAL MAMMOGRAM WITH TOMOSYNTHESIS AND CAD
TECHNIQUE: Bilateral digital diagnostic mammography and breast tomosynthesis
was performed. The images were evaluated with computer-aided
detection.

[L MLO]
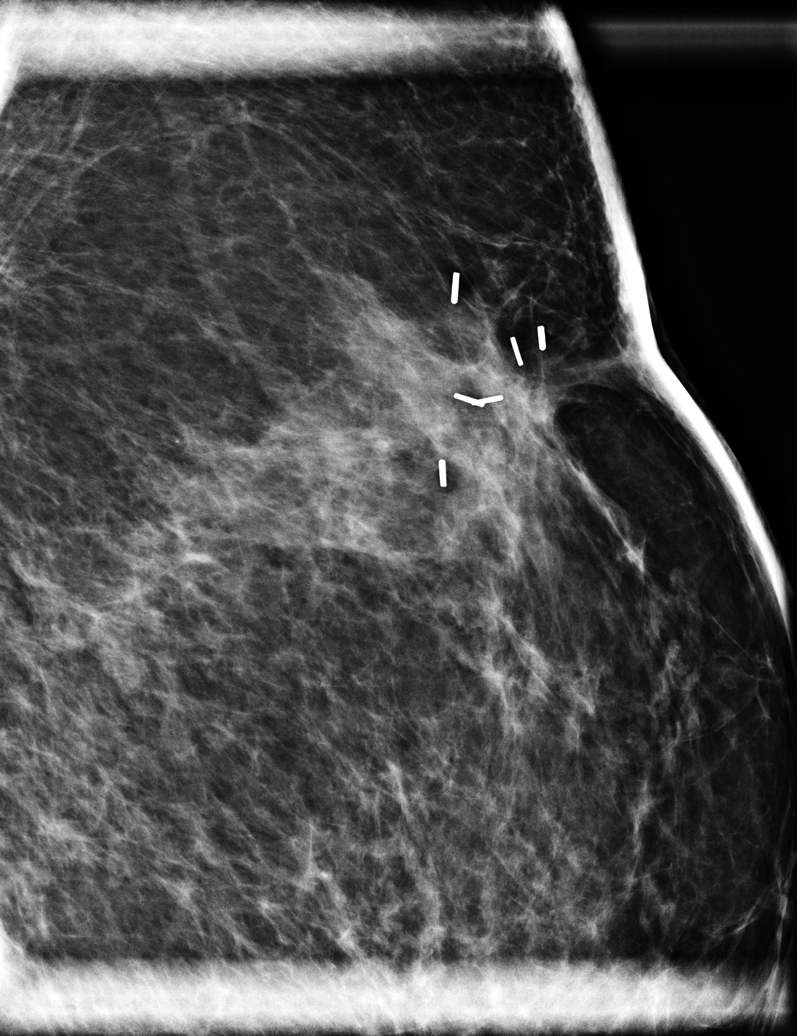

[L CC synth-2D]
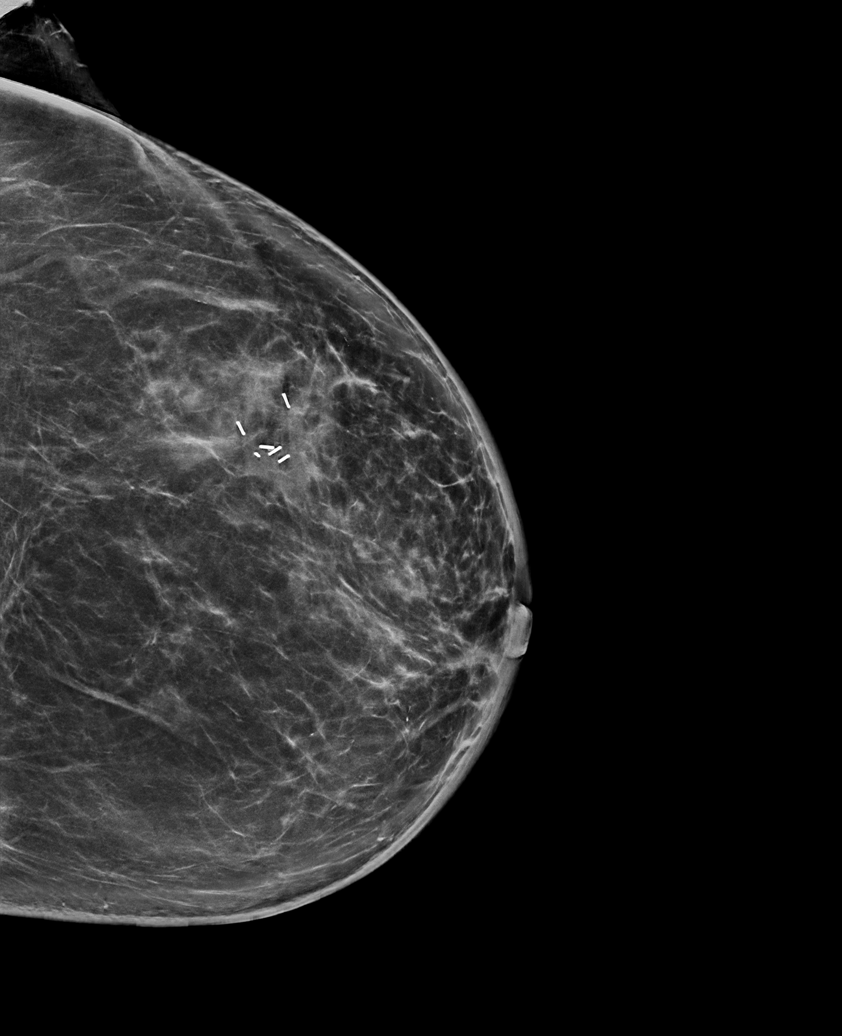

[R MLO synth-2D]
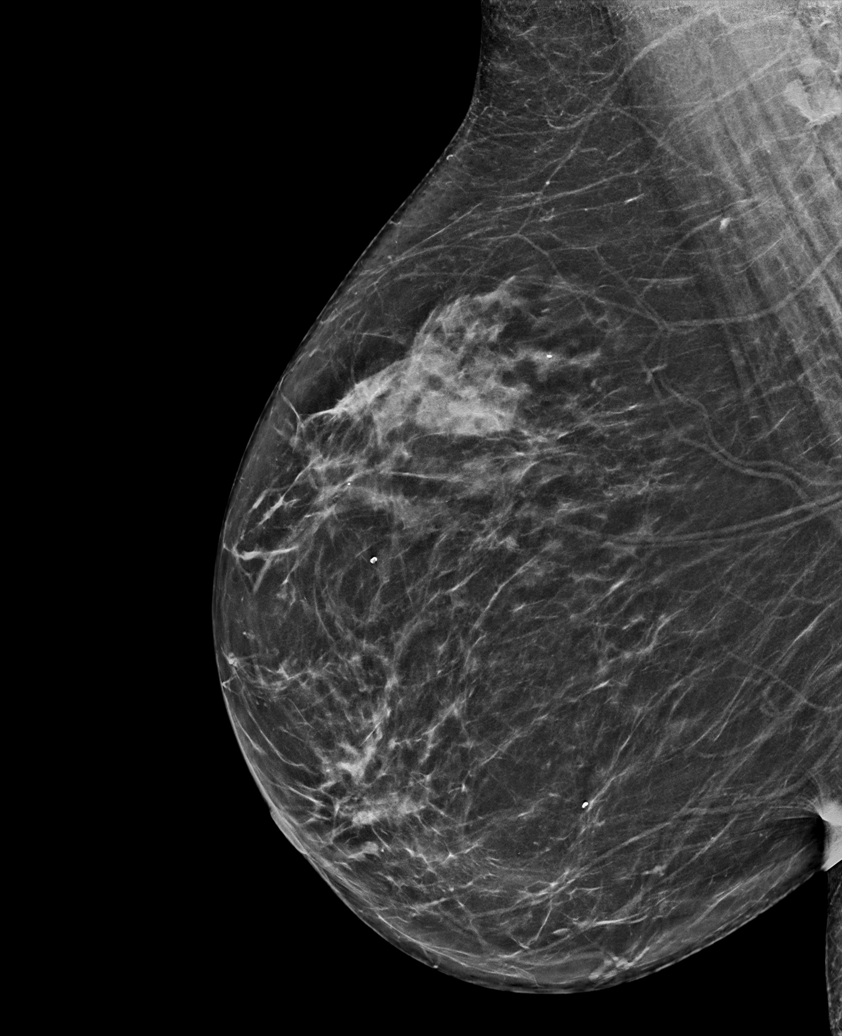

[R CC synth-2D]
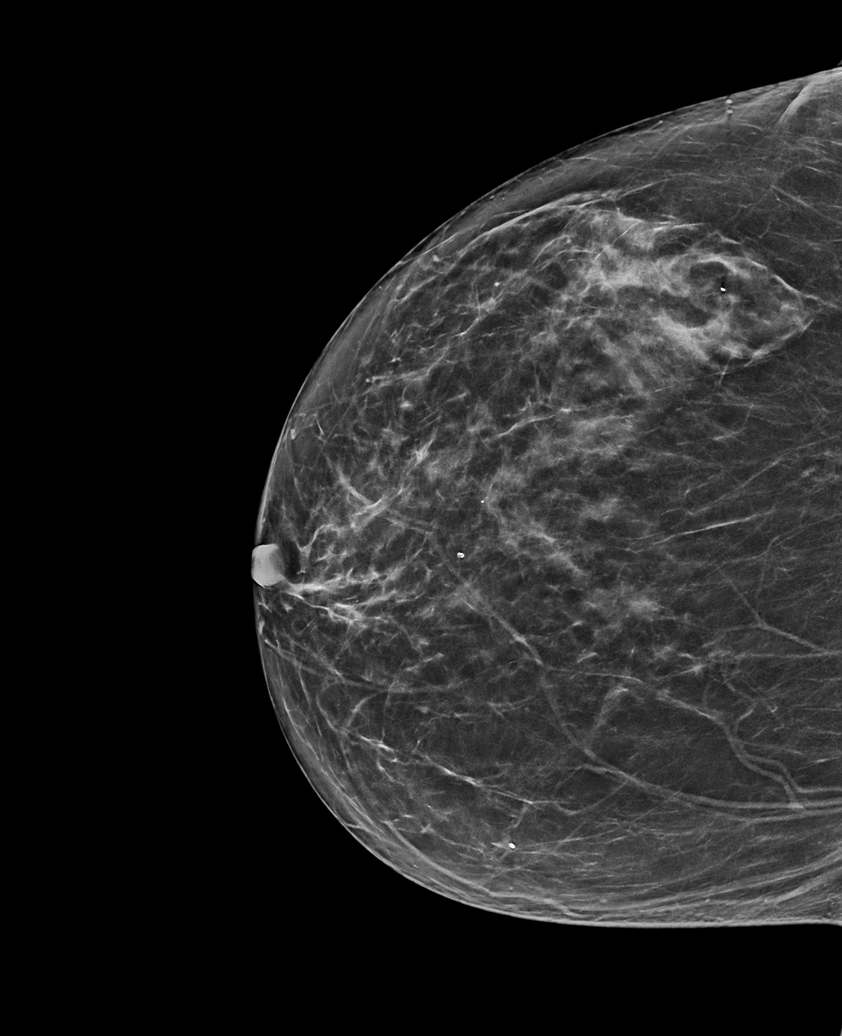

[L MLO synth-2D]
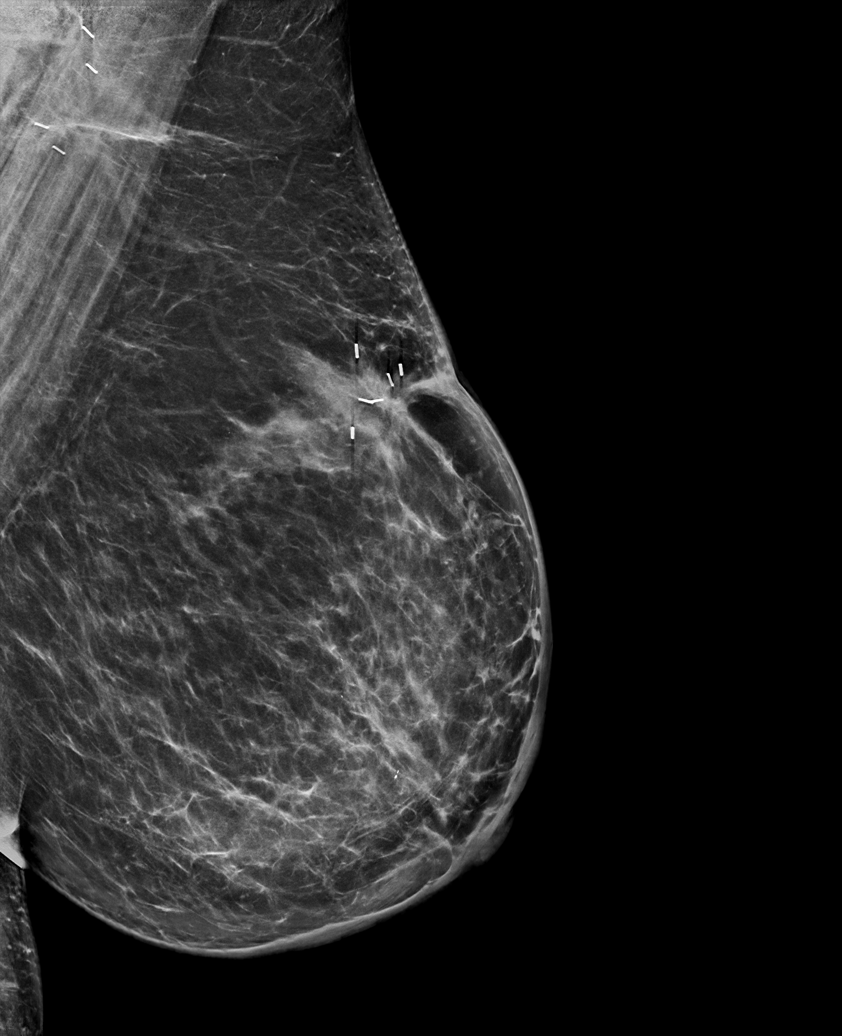

[L CC tomo · tomo slice 51/100.0]
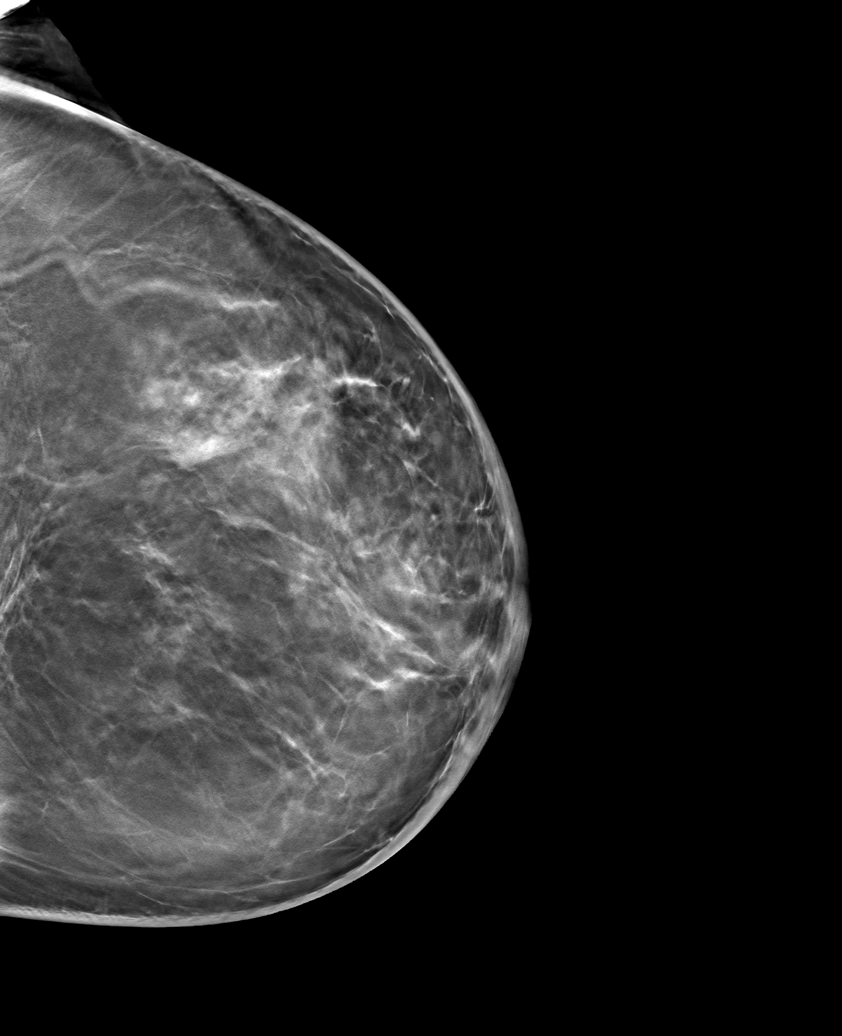

[6 of 25 positions shown; findings below may reference images not displayed]

ACR Breast Density Category b: There are scattered areas of
fibroglandular density.
FINDINGS: Interval post lumpectomy changes in the upper-outer left breast and
axilla. Diffuse left-sided skin and trabecular thickening consistent
with radiation related changes. Otherwise, no new or suspicious
findings in either breast.
IMPRESSION: 1. No mammographic evidence of malignancy in either breast.
2. Interval left breast posttreatment changes.

RECOMMENDATION:
Diagnostic mammogram is suggested in 1 year. (Code:QN-I-31X)

I have discussed the findings and recommendations with the patient.
If applicable, a reminder letter will be sent to the patient
regarding the next appointment.

BI-RADS CATEGORY  2: Benign.

## 2022-07-26 ENCOUNTER — Ambulatory Visit: Payer: Medicare HMO | Admitting: Oncology

## 2022-08-10 NOTE — Progress Notes (Signed)
[place]  Telephone:(336) [phone] Fax:(336) [phone]  ID: NEENA BEECHAM OB: 06/24/1956  MR#: [id number]  CSN#:[id number]  Patient Care Team: Valerie Roys, DO as PCP - General (Family Medicine) Noreene Filbert, MD as Consulting Physician (Radiation Oncology) Lloyd Huger, MD as Consulting Physician (Oncology) Ronny Bacon, MD as Consulting Physician (General Surgery)  CHIEF COMPLAINT: Stage Ia ER/PR positive, HER-2 negative invasive carcinoma of the upper outer quadrant left breast.  Oncotype DX score low risk 8.  INTERVAL HISTORY: Patient returns to clinic today for routine 13-monthevaluation.  She occasionally has stiffness and pain in her hands upon waking, but otherwise feels well.  She is tolerating letrozole well without significant side effects.  She has no other neurologic complaints.  She denies any recent fevers or illnesses.  She has a good appetite and denies weight loss.  She has no chest pain, shortness of breath, cough, or hemoptysis.  She denies any nausea, vomiting, constipation, or diarrhea.  She has no urinary complaints.  Patient has no further specific complaints today.  REVIEW OF SYSTEMS:   Review of Systems  Constitutional: Negative.  Negative for fever and malaise/fatigue.  Respiratory: Negative.  Negative for cough, hemoptysis and shortness of breath.   Cardiovascular: Negative.  Negative for chest pain and leg swelling.  Gastrointestinal: Negative.  Negative for abdominal pain.  Genitourinary: Negative.  Negative for dysuria.  Musculoskeletal: Negative.  Negative for back pain.  Skin: Negative.  Negative for rash.  Neurological: Negative.  Negative for dizziness, focal weakness, weakness and headaches.  Psychiatric/Behavioral: Negative.  The patient is not nervous/anxious.     As per HPI. Otherwise, a complete review of systems is negative.  PAST MEDICAL HISTORY: Past Medical History:  Diagnosis Date   Bilateral carpal  tunnel syndrome    [place]ast [place]    left breast   Diverticulosis    found on colonoscopy on 05/24/16   Hypertension    Meralgia paraesthetica, left    Personal history of radiation therapy     PAST SURGICAL HISTORY: Past Surgical History:  Procedure Laterality Date   [facility]  BREAST LUMPECTOMY Left 01/01/2021   BREAST LUMPECTOMY,RADIO FREQ LOCALIZER,AXILLARY SENTINEL LYMPH NODE BIOPSY Left 01/01/2021   Procedure: BREAST LUMPECTOMY,RADIO FREQ LOCALIZER,AXILLARY SENTINEL LYMPH NODE BIOPSY;  Surgeon: RRonny Bacon MD;  Location: [facility] ORS;  Service: General;  Laterality: Left;  Left axilla   CHOLECYSTECTOMY  1990s   COLONOSCOPY  05/24/2016   internal hemorrhoids, diverticulosis   DENTAL SURGERY  2023    FAMILY HISTORY: Family History  Problem Relation Age of Onset   Hyperlipidemia Mother    Cancer Mother        lung   Cancer Father        esophageal   Throat cancer Maternal Grandmother    Breast cancer Neg Hx     ADVANCED DIRECTIVES (Y/N):  N  HEALTH MAINTENANCE: Social History   Tobacco Use   Smoking status: Never   Smokeless tobacco: Never  Vaping Use   Vaping Use: Never used  Substance Use Topics   Alcohol use: No   Drug use: No     Colonoscopy:  PAP:  Bone density:  Lipid panel:  No Known Allergies  Current Outpatient Medications  Medication Sig Dispense Refill   letrozole (FEMARA) 2.5 MG tablet Take 1 tablet (2.5 mg total) by mouth daily. 90 tablet 3   lisinopril-hydrochlorothiazide (ZESTORETIC) 20-12.5 MG tablet Take 1 tablet by mouth  daily. 90 tablet 1   pyridoxine (B-6) 200 MG tablet Take 200 mg by mouth daily.     No current facility-administered medications for this visit.    OBJECTIVE: Vitals:   08/11/22 1052  BP: (!) 133/94  Pulse: 75  Temp: (!) 97.1 F (36.2 C)     Body mass index is 35.54 kg/m.    ECOG FS:0 - Asymptomatic  General: Well-developed, well-nourished, no acute distress. Eyes: Pink  conjunctiva, anicteric sclera. HEENT: Normocephalic, moist mucous membranes. Lungs: No audible wheezing or coughing. Heart: Regular rate and rhythm. Abdomen: Soft, nontender, no obvious distention. Musculoskeletal: No edema, cyanosis, or clubbing. Neuro: Alert, answering all questions appropriately. Cranial nerves grossly intact. Skin: No rashes or petechiae noted. Psych: Normal affect.  LAB RESULTS:  Lab Results  Component Value Date   NA 141 03/21/2022   K 3.9 03/21/2022   CL 100 03/21/2022   CO2 25 03/21/2022   GLUCOSE 97 03/21/2022   BUN 7 (L) 03/21/2022   CREATININE 0.85 03/21/2022   CALCIUM 10.3 03/21/2022   PROT 7.7 03/21/2022   ALBUMIN 4.7 03/21/2022   AST 25 03/21/2022   ALT 18 03/21/2022   ALKPHOS 93 03/21/2022   BILITOT 0.6 03/21/2022   GFRNONAA >60 12/24/2020   GFRAA 77 09/16/2020    Lab Results  Component Value Date   WBC 9.4 03/21/2022   NEUTROABS 7.3 (H) 03/21/2022   HGB 15.6 03/21/2022   HCT 46.0 03/21/2022   MCV 84 03/21/2022   PLT 248 03/21/2022     STUDIES: No results found.  ASSESSMENT: Stage Ia ER/PR positive, HER-2 negative invasive carcinoma of the upper outer quadrant left breast.  Oncotype DX score low risk 8.  PLAN:    1. Stage Ia ER/PR positive, HER-2 negative invasive carcinoma of the upper outer quadrant left breast: Oncotype DX score low risk 8.  Given patient's stage of disease and low risk Oncotype, she did not require adjuvant chemotherapy.  Patient completed XRT on Apr 12, 2021.  Continue letrozole for a total of 5 years completing treatment in May 2027.  Her most recent mammogram on February 04, 2022 was reported BI-RADS 2.  Repeat in May 2024 along with bone mineral density.  Return to clinic in 6 months for routine evaluation. 2.  Osteopenia: Patient's bone density on Apr 19, 2021 revealed a T score of -1.9.  Due calcium and vitamin D supplementation.  Repeat in May 2024 along with mammogram as above.  3.  Hand stiffness: Does not  appear to be related to letrozole.  Monitor and continue symptomatic treatment.     Patient expressed understanding and was in agreement with this plan. She also understands that She can call clinic at any time with any questions, concerns, or complaints.    Cancer Staging  Malignant neoplasm of upper-outer quadrant of left female breast [facility]) Staging form: Breast, AJCC 8th Edition - Pathologic stage from 01/22/2021: Stage IA (pT1c, pN0, cM0, G1, ER+, PR+, HER2-, Oncotype DX score: 8) - Signed by Lloyd Huger, MD on 01/22/2021 Stage prefix: Initial diagnosis Multigene prognostic tests performed: Oncotype DX Recurrence score range: Less than 11 Histologic grading system: 3 grade system   Lloyd Huger, MD   08/11/2022 2:05 PM

## 2022-08-11 ENCOUNTER — Encounter: Payer: Self-pay | Admitting: Oncology

## 2022-08-11 ENCOUNTER — Inpatient Hospital Stay: Payer: Medicare HMO | Attending: Oncology | Admitting: Oncology

## 2022-08-11 VITALS — BP 133/94 | HR 75 | Temp 97.1°F | Ht 60.5 in | Wt 185.0 lb

## 2022-08-11 DIAGNOSIS — Z79899 Other long term (current) drug therapy: Secondary | ICD-10-CM | POA: Insufficient documentation

## 2022-08-11 DIAGNOSIS — C50412 Malignant neoplasm of upper-outer quadrant of left female breast: Secondary | ICD-10-CM | POA: Diagnosis not present

## 2022-08-11 DIAGNOSIS — Z79811 Long term (current) use of aromatase inhibitors: Secondary | ICD-10-CM | POA: Diagnosis not present

## 2022-08-11 DIAGNOSIS — Z170 Estrogen receptor positive status [ER+]: Secondary | ICD-10-CM | POA: Insufficient documentation

## 2022-08-11 DIAGNOSIS — M8580 Other specified disorders of bone density and structure, unspecified site: Secondary | ICD-10-CM | POA: Diagnosis not present

## 2022-08-11 DIAGNOSIS — Z923 Personal history of irradiation: Secondary | ICD-10-CM | POA: Diagnosis not present

## 2022-09-21 ENCOUNTER — Ambulatory Visit (INDEPENDENT_AMBULATORY_CARE_PROVIDER_SITE_OTHER): Payer: Medicare HMO | Admitting: Family Medicine

## 2022-09-21 ENCOUNTER — Encounter: Payer: Self-pay | Admitting: Family Medicine

## 2022-09-21 VITALS — BP 109/73 | HR 76 | Temp 98.0°F | Wt 188.2 lb

## 2022-09-21 DIAGNOSIS — Z170 Estrogen receptor positive status [ER+]: Secondary | ICD-10-CM

## 2022-09-21 DIAGNOSIS — C50412 Malignant neoplasm of upper-outer quadrant of left female breast: Secondary | ICD-10-CM

## 2022-09-21 DIAGNOSIS — Z23 Encounter for immunization: Secondary | ICD-10-CM

## 2022-09-21 DIAGNOSIS — E785 Hyperlipidemia, unspecified: Secondary | ICD-10-CM | POA: Diagnosis not present

## 2022-09-21 DIAGNOSIS — I10 Essential (primary) hypertension: Secondary | ICD-10-CM

## 2022-09-21 DIAGNOSIS — H6122 Impacted cerumen, left ear: Secondary | ICD-10-CM

## 2022-09-21 MED ORDER — LISINOPRIL-HYDROCHLOROTHIAZIDE 20-12.5 MG PO TABS: 1.0000 | ORAL_TABLET | Freq: Every day | ORAL | 1 refills | Status: DC

## 2022-09-21 NOTE — Assessment & Plan Note (Signed)
Rechecking labs today. Await results. Treat as needed.  °

## 2022-09-21 NOTE — Progress Notes (Signed)
BP 109/73   Pulse 76   Temp 98 F (36.7 C)   Wt 188 lb 3.2 oz (85.4 kg)   SpO2 99%   BMI 36.15 kg/m    Subjective:    Patient ID: Jennifer Sanders, female    DOB: [birth year], 66 y.o.   MRN: [id number]  HPI: Jennifer Sanders is a 66 y.o. female  Chief Complaint  Patient presents with   Hypertension    Patient here for 6 month follow up    HYPERTENSION / [place] Satisfied with current treatment? yes Duration of hypertension: chronic BP monitoring frequency: not checking BP medication side effects: no Past BP meds: lisinopril-HCTZ Duration of hyperlipidemia: about 6 months Cholesterol medication side effects: N/A Cholesterol supplements: none Past cholesterol medications: none Medication compliance: excellent compliance Aspirin: no Recent stressors: no Recurrent headaches: no Visual changes: no Palpitations: no Dyspnea: no Chest pain: no Lower extremity edema: no Dizzy/lightheaded: no   Relevant past medical, surgical, family and social history reviewed and updated as indicated. Interim medical history since our last visit reviewed. Allergies and medications reviewed and updated.  Review of Systems  Constitutional: Negative.   Respiratory: Negative.    Cardiovascular: Negative.   Gastrointestinal: Negative.   Musculoskeletal: Negative.   Neurological: Negative.   Psychiatric/Behavioral: Negative.      Per HPI unless specifically indicated above     Objective:    BP 109/73   Pulse 76   Temp 98 F (36.7 C)   Wt 188 lb 3.2 oz (85.4 kg)   SpO2 99%   BMI 36.15 kg/m   Wt Readings from Last 3 Encounters:  09/21/22 188 lb 3.2 oz (85.4 kg)  08/11/22 185 lb (83.9 kg)  04/13/22 192 lb 9.6 oz (87.4 kg)    Physical Exam Vitals and nursing note reviewed.  Constitutional:      General: She is not in acute distress.    Appearance: Normal appearance. She is not ill-appearing, toxic-appearing or diaphoretic.  HENT:     Head: Normocephalic and  atraumatic.     Right Ear: External ear normal.     Left Ear: External ear normal.     Nose: Nose normal.     Mouth/Throat:     Mouth: Mucous membranes are moist.     Pharynx: Oropharynx is clear.  Eyes:     General: No scleral icterus.       Right eye: No discharge.        Left eye: No discharge.     Extraocular Movements: Extraocular movements intact.     Conjunctiva/sclera: Conjunctivae normal.     Pupils: Pupils are equal, round, and reactive to light.  Cardiovascular:     Rate and Rhythm: Normal rate and regular rhythm.     Pulses: Normal pulses.     Heart sounds: Normal heart sounds. No murmur heard.    No friction rub. No gallop.  Pulmonary:     Effort: Pulmonary effort is normal. No respiratory distress.     Breath sounds: Normal breath sounds. No stridor. No wheezing, rhonchi or rales.  Chest:     Chest wall: No tenderness.  Musculoskeletal:        General: Normal range of motion.     Cervical back: Normal range of motion and neck supple.  Skin:    General: Skin is warm and dry.     Capillary Refill: Capillary refill takes less than 2 seconds.     Coloration: Skin is not jaundiced or pale.  Findings: No bruising, erythema, lesion or rash.  Neurological:     General: No focal deficit present.     Mental Status: She is alert and oriented to person, place, and time. Mental status is at baseline.  Psychiatric:        Mood and Affect: Mood normal.        Behavior: Behavior normal.        Thought Content: Thought content normal.        Judgment: Judgment normal.     Results for orders placed or performed in visit on 03/21/22  CBC with Differential/Platelet  Result Value Ref Range   WBC 9.4 3.4 - 10.8 x10E3/uL   RBC 5.45 (H) 3.77 - 5.28 x10E6/uL   Hemoglobin 15.6 11.1 - 15.9 g/dL   Hematocrit 46.0 34.0 - 46.6 %   MCV 84 79 - 97 fL   MCH 28.6 26.6 - 33.0 pg   MCHC 33.9 31.5 - 35.7 g/dL   RDW 13.6 11.7 - 15.4 %   Platelets 248 150 - 450 x10E3/uL   Neutrophils  78 Not Estab. %   Lymphs 14 Not Estab. %   Monocytes 7 Not Estab. %   Eos 1 Not Estab. %   Basos 0 Not Estab. %   Neutrophils Absolute 7.3 (H) 1.4 - 7.0 x10E3/uL   Lymphocytes Absolute 1.3 0.7 - 3.1 x10E3/uL   Monocytes Absolute 0.6 0.1 - 0.9 x10E3/uL   EOS (ABSOLUTE) 0.1 0.0 - 0.4 x10E3/uL   Basophils Absolute 0.0 0.0 - 0.2 x10E3/uL   Immature Granulocytes 0 Not Estab. %   Immature Grans (Abs) 0.0 0.0 - 0.1 x10E3/uL  Comprehensive metabolic panel  Result Value Ref Range   Glucose 97 70 - 99 mg/dL   BUN 7 (L) 8 - 27 mg/dL   Creatinine, Ser 0.85 0.57 - 1.00 mg/dL   eGFR 76 >59 mL/min/1.73   BUN/Creatinine Ratio 8 (L) 12 - 28   Sodium 141 134 - 144 mmol/L   Potassium 3.9 3.5 - 5.2 mmol/L   Chloride 100 96 - 106 mmol/L   CO2 25 20 - 29 mmol/L   Calcium 10.3 8.7 - 10.3 mg/dL   Total Protein 7.7 6.0 - 8.5 g/dL   Albumin 4.7 3.8 - 4.8 g/dL   Globulin, Total 3.0 1.5 - 4.5 g/dL   Albumin/Globulin Ratio 1.6 1.2 - 2.2   Bilirubin Total 0.6 0.0 - 1.2 mg/dL   Alkaline Phosphatase 93 44 - 121 IU/L   AST 25 0 - 40 IU/L   ALT 18 0 - 32 IU/L  Lipid Panel w/o Chol/HDL Ratio  Result Value Ref Range   Cholesterol, Total 207 (H) 100 - 199 mg/dL   Triglycerides 145 0 - 149 mg/dL   HDL 67 >39 mg/dL   VLDL Cholesterol Cal 25 5 - 40 mg/dL   LDL Chol Calc (NIH) 115 (H) 0 - 99 mg/dL  Urinalysis, Routine w reflex microscopic  Result Value Ref Range   Specific Gravity, UA 1.015 1.005 - 1.030   pH, UA 6.0 5.0 - 7.5   Color, UA Yellow Yellow   Appearance Ur Clear Clear   Leukocytes,UA Negative Negative   Protein,UA Negative Negative/Trace   Glucose, UA Negative Negative   Ketones, UA Negative Negative   RBC, UA Negative Negative   Bilirubin, UA Negative Negative   Urobilinogen, Ur 0.2 0.2 - 1.0 mg/dL   Nitrite, UA Negative Negative  TSH  Result Value Ref Range   TSH 2.760 0.450 - 4.500 uIU/mL  Microalbumin, Urine Waived  Result Value Ref Range   Microalb, Ur Waived 10 0 - 19 mg/L    Creatinine, Urine Waived 50 10 - 300 mg/dL   Microalb/Creat Ratio <30 <30 mg/g  Bayer DCA Hb A1c Waived  Result Value Ref Range   HB A1C (BAYER DCA - WAIVED) 5.4 4.8 - 5.6 %      Assessment & Plan:   Problem List Items Addressed This Visit       Cardiovascular and Mediastinum   Hypertension - Primary    Under good control on current regimen. Continue current regimen. Continue to monitor. Call with any concerns. Refills given. Labs drawn today.       Relevant Medications   lisinopril-hydrochlorothiazide (ZESTORETIC) 20-12.5 MG tablet   Other Relevant Orders   Comprehensive metabolic panel   CBC with Differential/Platelet     Other   Malignant neoplasm of upper-outer quadrant of left female breast ([place])    Continue to follow with oncology. Call with any concerns. Continue to monitor.       Hyperlipidemia    Rechecking labs today. Await results. Treat as needed.       Relevant Medications   lisinopril-hydrochlorothiazide (ZESTORETIC) 20-12.5 MG tablet   Other Relevant Orders   Comprehensive metabolic panel   CBC with Differential/Platelet   Lipid Panel w/o Chol/HDL Ratio   Other Visit Diagnoses     Hearing loss of left ear due to cerumen impaction       Ear flushed today with fair results.    Need for influenza vaccination       Flu shot given today.   Relevant Orders   Flu Vaccine QUAD High Dose(Fluad) (Completed)        Follow up plan: Return in about 6 months (around 03/23/2023) for physical.

## 2022-09-21 NOTE — Assessment & Plan Note (Signed)
Under good control on current regimen. Continue current regimen. Continue to monitor. Call with any concerns. Refills given. Labs drawn today.   

## 2022-09-21 NOTE — Assessment & Plan Note (Signed)
Continue to follow with oncology. Call with any concerns. Continue to monitor.  

## 2022-09-22 LAB — COMPREHENSIVE METABOLIC PANEL
ALT: 15 IU/L (ref 0–32)
AST: 22 IU/L (ref 0–40)
Albumin/Globulin Ratio: 1.6 (ref 1.2–2.2)
Albumin: 4.6 g/dL (ref 3.9–4.9)
Alkaline Phosphatase: 79 IU/L (ref 44–121)
BUN/Creatinine Ratio: 11 — ABNORMAL LOW (ref 12–28)
BUN: 10 mg/dL (ref 8–27)
Bilirubin Total: 0.4 mg/dL (ref 0.0–1.2)
CO2: 22 mmol/L (ref 20–29)
Calcium: 9.7 mg/dL (ref 8.7–10.3)
Chloride: 99 mmol/L (ref 96–106)
Creatinine, Ser: 0.93 mg/dL (ref 0.57–1.00)
Globulin, Total: 2.9 g/dL (ref 1.5–4.5)
Glucose: 102 mg/dL — ABNORMAL HIGH (ref 70–99)
Potassium: 4 mmol/L (ref 3.5–5.2)
Sodium: 139 mmol/L (ref 134–144)
Total Protein: 7.5 g/dL (ref 6.0–8.5)
eGFR: 68 mL/min/{1.73_m2} (ref >59)

## 2022-09-22 LAB — LIPID PANEL W/O CHOL/HDL RATIO
Cholesterol, Total: 193 mg/dL (ref 100–199)
HDL: 63 mg/dL (ref >39)
LDL Chol Calc (NIH): 112 mg/dL — ABNORMAL HIGH (ref 0–99)
Triglycerides: 99 mg/dL (ref 0–149)
VLDL Cholesterol Cal: 18 mg/dL (ref 5–40)

## 2022-09-22 LAB — CBC WITH DIFFERENTIAL/PLATELET
Basophils Absolute: 0 10*3/uL (ref 0.0–0.2)
Basos: 1 %
EOS (ABSOLUTE): 0.1 10*3/uL (ref 0.0–0.4)
Eos: 2 %
Hematocrit: 43.6 % (ref 34.0–46.6)
Hemoglobin: 14.5 g/dL (ref 11.1–15.9)
Immature Grans (Abs): 0 10*3/uL (ref 0.0–0.1)
Immature Granulocytes: 0 %
Lymphocytes Absolute: 1.1 10*3/uL (ref 0.7–3.1)
Lymphs: 17 %
MCH: 28.9 pg (ref 26.6–33.0)
MCHC: 33.3 g/dL (ref 31.5–35.7)
MCV: 87 fL (ref 79–97)
Monocytes Absolute: 0.5 10*3/uL (ref 0.1–0.9)
Monocytes: 8 %
Neutrophils Absolute: 4.5 10*3/uL (ref 1.4–7.0)
Neutrophils: 72 %
Platelets: 217 10*3/uL (ref 150–450)
RBC: 5.02 x10E6/uL (ref 3.77–5.28)
RDW: 13.1 % (ref 11.7–15.4)
WBC: 6.3 10*3/uL (ref 3.4–10.8)

## 2022-10-13 ENCOUNTER — Ambulatory Visit
Admission: RE | Admit: 2022-10-13 | Discharge: 2022-10-13 | Disposition: A | Discharge status: Home / Self Care | Payer: Medicare HMO | Source: Ambulatory Visit | Attending: Radiation Oncology | Admitting: Radiation Oncology

## 2022-10-13 ENCOUNTER — Encounter: Payer: Self-pay | Admitting: Radiation Oncology

## 2022-10-13 VITALS — BP 126/91 | HR 75 | Temp 97.3°F | Resp 14 | Ht 60.0 in | Wt 186.0 lb

## 2022-10-13 DIAGNOSIS — Z923 Personal history of irradiation: Secondary | ICD-10-CM | POA: Diagnosis not present

## 2022-10-13 DIAGNOSIS — Z79811 Long term (current) use of aromatase inhibitors: Secondary | ICD-10-CM | POA: Insufficient documentation

## 2022-10-13 DIAGNOSIS — C50412 Malignant neoplasm of upper-outer quadrant of left female breast: Secondary | ICD-10-CM | POA: Diagnosis not present

## 2022-10-13 DIAGNOSIS — Z170 Estrogen receptor positive status [ER+]: Secondary | ICD-10-CM | POA: Insufficient documentation

## 2022-10-13 DIAGNOSIS — Z510 Encounter for antineoplastic radiation therapy: Secondary | ICD-10-CM | POA: Insufficient documentation

## 2022-10-13 NOTE — Progress Notes (Signed)
Radiation Oncology Follow up Note  Name: Jennifer Sanders   Date:   10/13/2022 MRN:  [id number] DOB: [birth year]    This 66 y.o. female presents to the clinic today for 1-1/2-year follow-up status post whole breast radiation for ER/PR positive invasive mammary carcinoma.  REFERRING PROVIDER: Valerie Roys, DO  HPI: Patient is a 66 year old female now out a year and a half having completed whole breast radiation to her left breast for stage Ia ER/PR positive invasive mammary carcinoma.  Seen today in routine follow-up she is doing well.  She specifically denies breast tenderness cough or bone pain.  She had mammograms.  Back in March which I have reviewed were BI-RADS 2 benign.  She is currently on Femara tolerating it well without side effect.  COMPLICATIONS OF TREATMENT: none  FOLLOW UP COMPLIANCE: keeps appointments   PHYSICAL EXAM:  BP (!) 126/91   Pulse 75   Temp (!) 97.3 F (36.3 C)   Resp 14   Ht 5' (1.524 m)   Wt 186 lb (84.4 kg)   BMI 36.33 kg/m  Lungs are clear to A&P cardiac examination essentially unremarkable with regular rate and rhythm. No dominant mass or nodularity is noted in either breast in 2 positions examined. Incision is well-healed. No axillary or supraclavicular adenopathy is appreciated. Cosmetic result is excellent.  Well-developed well-nourished patient in NAD. HEENT reveals PERLA, EOMI, discs not visualized.  Oral cavity is clear. No oral mucosal lesions are identified. Neck is clear without evidence of cervical or supraclavicular adenopathy. Lungs are clear to A&P. Cardiac examination is essentially unremarkable with regular rate and rhythm without murmur rub or thrill. Abdomen is benign with no organomegaly or masses noted. Motor sensory and DTR levels are equal and symmetric in the upper and lower extremities. Cranial nerves II through XII are grossly intact. Proprioception is intact. No peripheral adenopathy or edema is identified. No motor or sensory  levels are noted. Crude visual fields are within normal range.  RADIOLOGY RESULTS: No current films to review  PLAN: Present time patient is doing well now out a year and a half with no evidence of disease.  And pleased with her overall progress.  I will see her back in 1 year for follow-up.  She continues on Femara without side effect.  Patient knows to call with any concerns.  I would like to take this opportunity to thank you for allowing me to participate in the care of your patient.Noreene Filbert, MD

## 2022-10-27 ENCOUNTER — Other Ambulatory Visit: Payer: Self-pay

## 2022-10-27 DIAGNOSIS — Z170 Estrogen receptor positive status [ER+]: Secondary | ICD-10-CM

## 2022-10-27 DIAGNOSIS — Z79811 Long term (current) use of aromatase inhibitors: Secondary | ICD-10-CM

## 2022-10-29 ENCOUNTER — Other Ambulatory Visit: Payer: Self-pay | Admitting: Oncology

## 2022-11-01 ENCOUNTER — Other Ambulatory Visit: Payer: Self-pay

## 2022-11-01 DIAGNOSIS — C50412 Malignant neoplasm of upper-outer quadrant of left female breast: Secondary | ICD-10-CM

## 2022-11-01 DIAGNOSIS — Z170 Estrogen receptor positive status [ER+]: Secondary | ICD-10-CM

## 2022-11-30 ENCOUNTER — Other Ambulatory Visit: Payer: Medicare HMO

## 2022-11-30 ENCOUNTER — Ambulatory Visit: Payer: Self-pay | Admitting: *Deleted

## 2022-11-30 DIAGNOSIS — R300 Dysuria: Secondary | ICD-10-CM

## 2022-11-30 LAB — URINALYSIS, ROUTINE W REFLEX MICROSCOPIC
Bilirubin, UA: NEGATIVE
Glucose, UA: NEGATIVE
Ketones, UA: NEGATIVE
Leukocytes,UA: NEGATIVE
Nitrite, UA: NEGATIVE
Protein,UA: NEGATIVE
RBC, UA: NEGATIVE
Specific Gravity, UA: <1.005 — ABNORMAL LOW (ref 1.005–1.030)
Urobilinogen, Ur: 0.2 mg/dL (ref 0.2–1.0)
pH, UA: 5.5 (ref 5.0–7.5)

## 2022-11-30 NOTE — Telephone Encounter (Signed)
Summary: urinary discomfort   The patient has experienced urinary discomfort for multiple weeks  The patient has noticed a slight odor to their urine as well as an increased frequency.  The patient shares that they do experience an uncomfortable sensation when urinating  Please contact the patient further when possible        Chief Complaint: Urinary Symptoms Symptoms: Frequency, urgency, burning Frequency: 2 weeks Pertinent Negatives: Patient denies fever, lower back pain Disposition: '[]'$ ED /'[]'$ Urgent Care (no appt availability in office) / '[x]'$ Appointment(In office/virtual)/ '[]'$  [place] [facility]/ '[]'$ Home Care/ '[]'$ Refused Recommended Disposition /'[]'$  Mobile Bus/ '[]'$  Follow-up with PCP Additional Notes: Pt questioning if she can come in and submit urine sample at lab. Called practice for consult, Ubaldo Glassing, states may do so and is securing VV tomorrow. Call transferred back to pt for scheduling.  Reason for Disposition  Urinating more frequently than usual (i.e., frequency)  Answer Assessment - Initial Assessment Questions 1. SYMPTOM: "What's the main symptom you're concerned about?" (e.g., frequency, incontinence)     Frequency, urgency, dysuria 2. ONSET: "When did the   start?"     2 weeks ago 3. PAIN: "Is there any pain?" If Yes, ask: "How bad is it?" (Scale: 1-10; mild, moderate, severe)     Mild burning 4. CAUSE: "What do you think is causing the symptoms?"     UTI 5. OTHER SYMPTOMS: "Do you have any other symptoms?" (e.g., blood in urine, fever, flank pain, pain with urination)     no  Protocols used: Urinary Symptoms-A-AH

## 2022-12-01 ENCOUNTER — Encounter: Payer: Self-pay | Admitting: Nurse Practitioner

## 2022-12-01 ENCOUNTER — Telehealth (INDEPENDENT_AMBULATORY_CARE_PROVIDER_SITE_OTHER): Payer: Medicare HMO | Admitting: Nurse Practitioner

## 2022-12-01 VITALS — BP 133/87

## 2022-12-01 DIAGNOSIS — N3000 Acute cystitis without hematuria: Secondary | ICD-10-CM

## 2022-12-01 MED ORDER — NITROFURANTOIN MONOHYD MACRO 100 MG PO CAPS: 100.0000 mg | ORAL_CAPSULE | Freq: Two times a day (BID) | ORAL | 0 refills | Status: DC

## 2022-12-01 NOTE — Progress Notes (Signed)
BP 133/87    Subjective:    Patient ID: Jennifer Sanders, female    DOB: May 31, 1956, 67 y.o.   MRN: [id number]  HPI: Jennifer Sanders is a 67 y.o. female  Chief Complaint  Patient presents with   Urinary Tract Infection    Pt states she has been having a burning sensation when urinating, urinary frequency and a slight odor. States this has been going on for a couple of weeks.    URINARY SYMPTOMS  Dysuria: yes Urinary frequency: yes Urgency: yes Small volume voids: no Symptom severity: no Urinary incontinence: no Foul odor: yes Hematuria: no Abdominal pain: no Back pain: no Suprapubic pain/pressure: no Flank pain: no Fever:  no Vomiting: no Relief with cranberry juice: no Relief with pyridium: no Status: stable Previous urinary tract infection: no Recurrent urinary tract infection: no Sexual activity: No sexually active/monogomous/practicing safe sex History of sexually transmitted disease: no Penile discharge: no Treatments attempted: none   Relevant past medical, surgical, family and social history reviewed and updated as indicated. Interim medical history since our last visit reviewed. Allergies and medications reviewed and updated.  Review of Systems  Constitutional:  Negative for fever.  Gastrointestinal:  Negative for abdominal pain and vomiting.  Genitourinary:  Positive for dysuria, frequency and urgency. Negative for decreased urine volume, flank pain and hematuria.  Musculoskeletal:  Negative for back pain.    Per HPI unless specifically indicated above     Objective:    BP 133/87   Wt Readings from Last 3 Encounters:  10/13/22 186 lb (84.4 kg)  09/21/22 188 lb 3.2 oz (85.4 kg)  08/11/22 185 lb (83.9 kg)    Physical Exam Vitals and nursing note reviewed.  HENT:     Head: Normocephalic.     Right Ear: Hearing normal.     Left Ear: Hearing normal.     Nose: Nose normal.  Eyes:     Pupils: Pupils are equal, round, and reactive to light.   Pulmonary:     Effort: Pulmonary effort is normal. No respiratory distress.  Neurological:     Mental Status: She is alert.  Psychiatric:        Mood and Affect: Mood normal.        Behavior: Behavior normal.        Thought Content: Thought content normal.        Judgment: Judgment normal.     Results for orders placed or performed in visit on 11/30/22  Urinalysis, Routine w reflex microscopic  Result Value Ref Range   Specific Gravity, UA <1.005 (L) 1.005 - 1.030   pH, UA 5.5 5.0 - 7.5   Color, UA Yellow Yellow   Appearance Ur Clear Clear   Leukocytes,UA Negative Negative   Protein,UA Negative Negative/Trace   Glucose, UA Negative Negative   Ketones, UA Negative Negative   RBC, UA Negative Negative   Bilirubin, UA Negative Negative   Urobilinogen, Ur 0.2 0.2 - 1.0 mg/dL   Nitrite, UA Negative Negative   Microscopic Examination Comment       Assessment & Plan:   Problem List Items Addressed This Visit   None Visit Diagnoses     Acute cystitis without hematuria    -  Primary   UA unremarkable. Will treat with macrobid due to symptoms. If not improved recommend following up with PCP.        Follow up plan: Return if symptoms worsen or fail to improve.  This visit was completed  via MyChart due to the restrictions of the COVID-19 pandemic. All issues as above were discussed and addressed. Physical exam was done as above through visual confirmation on MyChart. If it was felt that the patient should be evaluated in the office, they were directed there. The patient verbally consented to this visit. Location of the patient: Home Location of the provider: Office Those involved with this call:  Provider: Jon Billings, NP CMA: Yvonna Alanis, CMA Front Desk/Registration: Lynnell Catalan This encounter was conducted via video.  I spent 20 dedicated to the care of this patient on the date of this encounter to include previsit review of symptoms, plan of care and follow up,  face to face time with the patient, and post visit ordering of testing.

## 2022-12-01 NOTE — Progress Notes (Signed)
Results discussed with patient during visit.

## 2023-03-06 ENCOUNTER — Telehealth: Payer: Self-pay | Admitting: Family Medicine

## 2023-03-06 NOTE — Telephone Encounter (Signed)
Contacted [name] to schedule their annual wellness visit. Appointment made for 03/27/2023.  Verlee Rossetti; Care Guide Ambulatory Clinical Support [place] [facility] Direct Dial: [phone]

## 2023-03-24 ENCOUNTER — Encounter: Payer: Self-pay | Admitting: Family Medicine

## 2023-03-24 ENCOUNTER — Ambulatory Visit (INDEPENDENT_AMBULATORY_CARE_PROVIDER_SITE_OTHER): Payer: Medicare HMO | Admitting: Family Medicine

## 2023-03-24 VITALS — BP 118/71 | HR 77 | Temp 99.2°F | Ht 60.0 in | Wt 188.6 lb

## 2023-03-24 DIAGNOSIS — C50412 Malignant neoplasm of upper-outer quadrant of left female breast: Secondary | ICD-10-CM

## 2023-03-24 DIAGNOSIS — H6123 Impacted cerumen, bilateral: Secondary | ICD-10-CM

## 2023-03-24 DIAGNOSIS — I10 Essential (primary) hypertension: Secondary | ICD-10-CM | POA: Diagnosis not present

## 2023-03-24 DIAGNOSIS — D692 Other nonthrombocytopenic purpura: Secondary | ICD-10-CM | POA: Diagnosis not present

## 2023-03-24 DIAGNOSIS — Z170 Estrogen receptor positive status [ER+]: Secondary | ICD-10-CM | POA: Diagnosis not present

## 2023-03-24 DIAGNOSIS — Z0000 Encounter for general adult medical examination without abnormal findings: Secondary | ICD-10-CM | POA: Diagnosis not present

## 2023-03-24 DIAGNOSIS — E785 Hyperlipidemia, unspecified: Secondary | ICD-10-CM | POA: Diagnosis not present

## 2023-03-24 LAB — URINALYSIS, ROUTINE W REFLEX MICROSCOPIC
Bilirubin, UA: NEGATIVE
Glucose, UA: NEGATIVE
Ketones, UA: NEGATIVE
Leukocytes,UA: NEGATIVE
Nitrite, UA: NEGATIVE
Protein,UA: NEGATIVE
RBC, UA: NEGATIVE
Specific Gravity, UA: <1.005 — ABNORMAL LOW (ref 1.005–1.030)
Urobilinogen, Ur: 0.2 mg/dL (ref 0.2–1.0)
pH, UA: 5.5 (ref 5.0–7.5)

## 2023-03-24 LAB — MICROALBUMIN, URINE WAIVED
Creatinine, Urine Waived: 50 mg/dL (ref 10–300)
Microalb, Ur Waived: 10 mg/L (ref 0–19)

## 2023-03-24 MED ORDER — LISINOPRIL-HYDROCHLOROTHIAZIDE 20-12.5 MG PO TABS: 1.0000 | ORAL_TABLET | Freq: Every day | ORAL | 1 refills | Status: DC

## 2023-03-24 NOTE — Assessment & Plan Note (Signed)
Stable.       - Continue to monitor

## 2023-03-24 NOTE — Assessment & Plan Note (Signed)
Under good control on current regimen. Continue current regimen. Continue to monitor. Call with any concerns. Refills given. Labs drawn today.   

## 2023-03-24 NOTE — Assessment & Plan Note (Signed)
Continue to follow with oncology. Call with any concerns.  

## 2023-03-24 NOTE — Patient Instructions (Signed)
  Preventative Services:  Health Risk Assessment and Personalized Prevention Plan: Done today Bone Mass Measurements: Scheduled for May Breast Cancer Screening: Scheduled for May CVD Screening: Done today Cervical Cancer Screening: N/A Colon Cancer Screening: Up to date Depression Screening: Done today Diabetes Screening: Done today Glaucoma Screening: See your eye doctor Hepatitis B vaccine: N/A Hepatitis C screening: Up to date HIV Screening: Up to date Flu Vaccine: Get in the fall Lung cancer Screening: N/A Obesity Screening: Done today Pneumonia Vaccines (2): Up to date STI Screening: N/A

## 2023-03-24 NOTE — Progress Notes (Signed)
BP 118/71   Pulse 77   Temp 99.2 F (37.3 C) (Oral)   Ht 5' (1.524 m)   Wt 188 lb 9.6 oz (85.5 kg)   SpO2 97%   BMI 36.83 kg/m    Subjective:    Patient ID: Jennifer Sanders, female    DOB: 09-22-1956, 67 y.o.   MRN: [id number]  HPI: Jennifer Sanders is a 67 y.o. female presenting on 03/24/2023 for comprehensive medical examination. Current medical complaints include:  EAG CLOGGED Duration:  weeks Involved ear(s):  bilateral Sensation of feeling clogged/plugged: yes Decreased/muffled hearing:yes Ear pain: no Fever: no Otorrhea: no Hearing loss: yes Upper respiratory infection symptoms: no Using Q-Tips: no Status: worse History of cerumenosis: yes Treatments attempted: none  HYPERTENSION / HYPERLIPIDEMIA Satisfied with current treatment? yes Duration of hypertension: chronic BP monitoring frequency: not checking BP medication side effects: no Past BP meds: lisinopril-HCTZ Duration of hyperlipidemia: chronic Cholesterol medication side effects: N/A Cholesterol supplements: none Past cholesterol medications: none Medication compliance: excellent compliance Aspirin: no Recent stressors: no Recurrent headaches: no Visual changes: no Palpitations: no Dyspnea: no Chest pain: no Lower extremity edema: no Dizzy/lightheaded: no  Menopausal Symptoms: no  Functional Status Survey: Is the patient deaf or have difficulty hearing?: No Does the patient have difficulty seeing, even when wearing glasses/contacts?: No Does the patient have difficulty concentrating, remembering, or making decisions?: No Does the patient have difficulty walking or climbing stairs?: No Does the patient have difficulty dressing or bathing?: No Does the patient have difficulty doing errands alone such as visiting a doctor's office or shopping?: No     03/24/2023   10:23 AM 09/21/2022   10:17 AM 03/21/2022   10:19 AM 01/18/2022   11:07 AM 09/17/2021   10:04 AM  Fall Risk   Falls in the past  year? 0 0 0 0 0  Number falls in past yr: 0 0 0 0 0  Injury with Fall? 0 0 0 0 0  Risk for fall due to : No Fall Risks No Fall Risks No Fall Risks    Follow up Falls evaluation completed Falls evaluation completed Falls evaluation completed Falls evaluation completed;Falls prevention discussed;Education provided     Depression Screen    03/24/2023   10:23 AM 09/21/2022   10:17 AM 03/21/2022   10:36 AM 01/18/2022   11:06 AM 01/18/2022   10:36 AM  Depression screen PHQ 2/9  Decreased Interest 0 0 0 0 0  Down, Depressed, Hopeless 0 0 0 0 0  PHQ - 2 Score 0 0 0 0 0  Altered sleeping 0 0 0    Tired, decreased energy 0 0 0    Change in appetite 0 0 0    Feeling bad or failure about yourself  0 0 0    Trouble concentrating 0 0 0    Moving slowly or fidgety/restless 0 0 0    Suicidal thoughts 0 0 0    PHQ-9 Score 0 0 0    Difficult doing work/chores Not difficult at all Not difficult at all        Advanced Directives Does patient have a HCPOA?    yes Does patient have a living will or MOST form?  yes  Past Medical History:  Past Medical History:  Diagnosis Date   Bilateral carpal tunnel syndrome    Breast cancer (HCC)    left breast   Diverticulosis    found on colonoscopy on 05/24/16   Hypertension  Meralgia paraesthetica, left    Personal history of radiation therapy     Surgical History:  Past Surgical History:  Procedure Laterality Date   BREAST BIOPSY Left 12/18/2020   [facility]   BREAST LUMPECTOMY Left 01/01/2021   BREAST LUMPECTOMY,RADIO FREQ LOCALIZER,AXILLARY SENTINEL LYMPH NODE BIOPSY Left 01/01/2021   Procedure: BREAST LUMPECTOMY,RADIO FREQ LOCALIZER,AXILLARY SENTINEL LYMPH NODE BIOPSY;  Surgeon: Campbell Lerner, MD;  Location: [facility] ORS;  Service: General;  Laterality: Left;  Left axilla   CHOLECYSTECTOMY  1990s   COLONOSCOPY  05/24/2016   internal hemorrhoids, diverticulosis   DENTAL SURGERY  2023    Medications:  Current Outpatient Medications on File Prior  to Visit  Medication Sig   letrozole (FEMARA) 2.5 MG tablet Take 1 tablet (2.5 mg total) by mouth daily.   pyridoxine (B-6) 200 MG tablet Take 200 mg by mouth daily.   No current facility-administered medications on file prior to visit.    Allergies:  No Known Allergies  Social History:  Social History   Socioeconomic History   Marital status: Widowed    Spouse name: Not on file   Number of children: Not on file   Years of education: Not on file   Highest education level: Not on file  Occupational History   Not on file  Tobacco Use   Smoking status: Never   Smokeless tobacco: Never  Vaping Use   Vaping Use: Never used  Substance and Sexual Activity   Alcohol use: No   Drug use: No   Sexual activity: Yes  Other Topics Concern   Not on file  Social History Narrative   Not on file   Social Determinants of Health   Financial Resource Strain: Low Risk  (01/18/2022)   Overall Financial Resource Strain (CARDIA)    Difficulty of Paying Living Expenses: Not hard at all  Food Insecurity: No Food Insecurity (01/18/2022)   Hunger Vital Sign    Worried About Running Out of Food in the Last Year: Never true    Ran Out of Food in the Last Year: Never true  Transportation Needs: No Transportation Needs (01/18/2022)   PRAPARE - Administrator, Civil Service (Medical): No    Lack of Transportation (Non-Medical): No  Physical Activity: Insufficiently Active (01/18/2022)   Exercise Vital Sign    Days of Exercise per Week: 1 day    Minutes of Exercise per Session: 10 min  Stress: No Stress Concern Present (01/18/2022)   [employer] of Occupational Health - Occupational Stress Questionnaire    Feeling of Stress : Not at all  Social Connections: Moderately Isolated (01/18/2022)   Social Connection and Isolation Panel [NHANES]    Frequency of Communication with Friends and Family: More than three times a week    Frequency of Social Gatherings with Friends and Family:  Twice a week    Attends Religious Services: Never    Database administrator or Organizations: No    Attends Banker Meetings: Never    Marital Status: Married  Catering manager Violence: Not At Risk (01/18/2022)   Humiliation, Afraid, Rape, and Kick questionnaire    Fear of Current or Ex-Partner: No    Emotionally Abused: No    Physically Abused: No    Sexually Abused: No   Social History   Tobacco Use  Smoking Status Never  Smokeless Tobacco Never   Social History   Substance and Sexual Activity  Alcohol Use No    Family History:  Family  History  Problem Relation Age of Onset   Hyperlipidemia Mother    Cancer Mother        lung   Cancer Father        esophageal   Throat cancer Maternal Grandmother    Breast cancer Neg Hx     Past medical history, surgical history, medications, allergies, family history and social history reviewed with patient today and changes made to appropriate areas of the chart.   Review of Systems  Constitutional: Negative.   HENT: Negative.         B/L clogged ear  Eyes: Negative.   Respiratory: Negative.    Cardiovascular: Negative.   Gastrointestinal: Negative.   Genitourinary:  Positive for frequency. Negative for dysuria, flank pain, hematuria and urgency.  Musculoskeletal:  Positive for joint pain. Negative for back pain, falls, myalgias and neck pain.  Skin: Negative.   Neurological: Negative.   Endo/Heme/Allergies:  Positive for environmental allergies. Negative for polydipsia. Does not bruise/bleed easily.  Psychiatric/Behavioral: Negative.      All other ROS negative except what is listed above and in the HPI.      Objective:    BP 118/71   Pulse 77   Temp 99.2 F (37.3 C) (Oral)   Ht 5' (1.524 m)   Wt 188 lb 9.6 oz (85.5 kg)   SpO2 97%   BMI 36.83 kg/m   Wt Readings from Last 3 Encounters:  03/24/23 188 lb 9.6 oz (85.5 kg)  10/13/22 186 lb (84.4 kg)  09/21/22 188 lb 3.2 oz (85.4 kg)     Physical  Exam Vitals and nursing note reviewed.  Constitutional:      General: She is not in acute distress.    Appearance: Normal appearance. She is obese. She is not ill-appearing, toxic-appearing or diaphoretic.  HENT:     Head: Normocephalic and atraumatic.     Right Ear: Tympanic membrane, ear canal and external ear normal. There is impacted cerumen.     Left Ear: Tympanic membrane, ear canal and external ear normal. There is impacted cerumen.     Nose: Nose normal. No congestion or rhinorrhea.     Mouth/Throat:     Mouth: Mucous membranes are moist.     Pharynx: Oropharynx is clear. No oropharyngeal exudate or posterior oropharyngeal erythema.  Eyes:     General: No scleral icterus.       Right eye: No discharge.        Left eye: No discharge.     Extraocular Movements: Extraocular movements intact.     Conjunctiva/sclera: Conjunctivae normal.     Pupils: Pupils are equal, round, and reactive to light.  Neck:     Vascular: No carotid bruit.  Cardiovascular:     Rate and Rhythm: Normal rate and regular rhythm.     Pulses: Normal pulses.     Heart sounds: No murmur heard.    No friction rub. No gallop.  Pulmonary:     Effort: Pulmonary effort is normal. No respiratory distress.     Breath sounds: Normal breath sounds. No stridor. No wheezing, rhonchi or rales.  Chest:     Chest wall: No tenderness.  Abdominal:     General: Abdomen is flat. Bowel sounds are normal. There is no distension.     Palpations: Abdomen is soft. There is no mass.     Tenderness: There is no abdominal tenderness. There is no right CVA tenderness, left CVA tenderness, guarding or rebound.  Hernia: No hernia is present.  Genitourinary:    Comments: Breast and pelvic exams deferred with shared decision making Musculoskeletal:        General: No swelling, tenderness, deformity or signs of injury.     Cervical back: Normal range of motion and neck supple. No rigidity. No muscular tenderness.     Right lower  leg: No edema.     Left lower leg: No edema.  Lymphadenopathy:     Cervical: No cervical adenopathy.  Skin:    General: Skin is warm and dry.     Capillary Refill: Capillary refill takes less than 2 seconds.     Coloration: Skin is not jaundiced or pale.     Findings: No bruising, erythema, lesion or rash.  Neurological:     General: No focal deficit present.     Mental Status: She is alert and oriented to person, place, and time. Mental status is at baseline.     Cranial Nerves: No cranial nerve deficit.     Sensory: No sensory deficit.     Motor: No weakness.     Coordination: Coordination normal.     Gait: Gait normal.     Deep Tendon Reflexes: Reflexes normal.  Psychiatric:        Mood and Affect: Mood normal.        Behavior: Behavior normal.        Thought Content: Thought content normal.        Judgment: Judgment normal.        03/24/2023   10:51 AM 03/21/2022   10:44 AM  6CIT Screen  What Year? 0 points 0 points  What month? 0 points 0 points  What time? 0 points 0 points  Count back from 20 0 points 0 points  Months in reverse 0 points 0 points  Repeat phrase 2 points 8 points  Total Score 2 points 8 points    Results for orders placed or performed in visit on 11/30/22  Urinalysis, Routine w reflex microscopic  Result Value Ref Range   Specific Gravity, UA <1.005 (L) 1.005 - 1.030   pH, UA 5.5 5.0 - 7.5   Color, UA Yellow Yellow   Appearance Ur Clear Clear   Leukocytes,UA Negative Negative   Protein,UA Negative Negative/Trace   Glucose, UA Negative Negative   Ketones, UA Negative Negative   RBC, UA Negative Negative   Bilirubin, UA Negative Negative   Urobilinogen, Ur 0.2 0.2 - 1.0 mg/dL   Nitrite, UA Negative Negative   Microscopic Examination Comment       Assessment & Plan:   Problem List Items Addressed This Visit       Cardiovascular and Mediastinum   Hypertension    Under good control on current regimen. Continue current regimen. Continue  to monitor. Call with any concerns. Refills given. Labs drawn today.        Relevant Medications   lisinopril-hydrochlorothiazide (ZESTORETIC) 20-12.5 MG tablet   Other Relevant Orders   Comprehensive metabolic panel   Urinalysis, Routine w reflex microscopic   TSH   Microalbumin, Urine Waived   Senile purpura (HCC)    Stable. Continue to monitor.       Relevant Medications   lisinopril-hydrochlorothiazide (ZESTORETIC) 20-12.5 MG tablet   Other Relevant Orders   CBC with Differential/Platelet   Comprehensive metabolic panel     Other   Malignant neoplasm of upper-outer quadrant of left female breast (HCC)    Continue to follow with oncology. Call with  any concerns.       Hyperlipidemia    Under good control on current regimen. Continue current regimen. Continue to monitor. Call with any concerns. Refills given. Labs drawn today.       Relevant Medications   lisinopril-hydrochlorothiazide (ZESTORETIC) 20-12.5 MG tablet   Other Relevant Orders   Comprehensive metabolic panel   Lipid Panel w/o Chol/HDL Ratio   Other Visit Diagnoses     Encounter for annual wellness exam in Medicare patient    -  Primary   Preventative care discussed today as below.   Routine general medical examination at a health care facility       Vaccines up to date. Screening labs checked today. Mammo and DEXA ordered. Colonoscopy up to date. Continue diet and exercise. Call with any concerns.   Bilateral impacted cerumen       Ears flushed today with good results.        Preventative Services:  Health Risk Assessment and Personalized Prevention Plan: Done today Bone Mass Measurements: Scheduled for May Breast Cancer Screening: Scheduled for May CVD Screening: Done today Cervical Cancer Screening: N/A Colon Cancer Screening: Up to date Depression Screening: Done today Diabetes Screening: Done today Glaucoma Screening: See your eye doctor Hepatitis B vaccine: N/A Hepatitis C screening: Up  to date HIV Screening: Up to date Flu Vaccine: Get in the fall Lung cancer Screening: N/A Obesity Screening: Done today Pneumonia Vaccines (2): Up to date STI Screening: N/A  Follow up plan: Return in about 6 months (around 09/23/2023) for please cancel wellness on 4/29.   LABORATORY TESTING:  - Pap smear: not applicable  IMMUNIZATIONS:   - Tdap: Tetanus vaccination status reviewed: last tetanus booster within 10 years. - Influenza: Up to date - Pneumovax: Up to date - Prevnar: Up to date - Zostavax vaccine: Refused  SCREENING: -Mammogram: Up to date  - Colonoscopy: Up to date  - Bone Density: Up to date   PATIENT COUNSELING:   Advised to take 1 mg of folate supplement per day if capable of pregnancy.   Sexuality: Discussed sexually transmitted diseases, partner selection, use of condoms, avoidance of unintended pregnancy  and contraceptive alternatives.   Advised to avoid cigarette smoking.  I discussed with the patient that most people either abstain from alcohol or drink within safe limits (<=14/week and <=4 drinks/occasion for males, <=7/weeks and <= 3 drinks/occasion for females) and that the risk for alcohol disorders and other health effects rises proportionally with the number of drinks per week and how often a drinker exceeds daily limits.  Discussed cessation/primary prevention of drug use and availability of treatment for abuse.   Diet: Encouraged to adjust caloric intake to maintain  or achieve ideal body weight, to reduce intake of dietary saturated fat and total fat, to limit sodium intake by avoiding high sodium foods and not adding table salt, and to maintain adequate dietary potassium and calcium preferably from fresh fruits, vegetables, and low-fat dairy products.    stressed the importance of regular exercise  Injury prevention: Discussed safety belts, safety helmets, smoke detector, smoking near bedding or upholstery.   Dental health: Discussed  importance of regular tooth brushing, flossing, and dental visits.    NEXT PREVENTATIVE PHYSICAL DUE IN 1 YEAR. Return in about 6 months (around 09/23/2023) for please cancel wellness on 4/29.

## 2023-03-25 LAB — COMPREHENSIVE METABOLIC PANEL
ALT: 16 IU/L (ref 0–32)
AST: 24 IU/L (ref 0–40)
Albumin/Globulin Ratio: 1.7 (ref 1.2–2.2)
Albumin: 4.6 g/dL (ref 3.9–4.9)
Alkaline Phosphatase: 97 IU/L (ref 44–121)
BUN/Creatinine Ratio: 10 — ABNORMAL LOW (ref 12–28)
BUN: 9 mg/dL (ref 8–27)
Bilirubin Total: 0.7 mg/dL (ref 0.0–1.2)
CO2: 22 mmol/L (ref 20–29)
Calcium: 10.1 mg/dL (ref 8.7–10.3)
Chloride: 101 mmol/L (ref 96–106)
Creatinine, Ser: 0.92 mg/dL (ref 0.57–1.00)
Globulin, Total: 2.7 g/dL (ref 1.5–4.5)
Glucose: 106 mg/dL — ABNORMAL HIGH (ref 70–99)
Potassium: 4 mmol/L (ref 3.5–5.2)
Sodium: 140 mmol/L (ref 134–144)
Total Protein: 7.3 g/dL (ref 6.0–8.5)
eGFR: 69 mL/min/{1.73_m2} (ref >59)

## 2023-03-25 LAB — CBC WITH DIFFERENTIAL/PLATELET
Basophils Absolute: 0 10*3/uL (ref 0.0–0.2)
Basos: 0 %
EOS (ABSOLUTE): 0.1 10*3/uL (ref 0.0–0.4)
Eos: 1 %
Hematocrit: 41.3 % (ref 34.0–46.6)
Hemoglobin: 14.6 g/dL (ref 11.1–15.9)
Immature Grans (Abs): 0 10*3/uL (ref 0.0–0.1)
Immature Granulocytes: 0 %
Lymphocytes Absolute: 1.2 10*3/uL (ref 0.7–3.1)
Lymphs: 14 %
MCH: 30.7 pg (ref 26.6–33.0)
MCHC: 35.4 g/dL (ref 31.5–35.7)
MCV: 87 fL (ref 79–97)
Monocytes Absolute: 0.6 10*3/uL (ref 0.1–0.9)
Monocytes: 7 %
Neutrophils Absolute: 6.7 10*3/uL (ref 1.4–7.0)
Neutrophils: 78 %
Platelets: 218 10*3/uL (ref 150–450)
RBC: 4.76 x10E6/uL (ref 3.77–5.28)
RDW: 13.8 % (ref 11.7–15.4)
WBC: 8.5 10*3/uL (ref 3.4–10.8)

## 2023-03-25 LAB — LIPID PANEL W/O CHOL/HDL RATIO
Cholesterol, Total: 176 mg/dL (ref 100–199)
HDL: 61 mg/dL (ref >39)
LDL Chol Calc (NIH): 95 mg/dL (ref 0–99)
Triglycerides: 111 mg/dL (ref 0–149)
VLDL Cholesterol Cal: 20 mg/dL (ref 5–40)

## 2023-03-25 LAB — TSH: TSH: 2.32 u[IU]/mL (ref 0.450–4.500)

## 2023-04-25 ENCOUNTER — Ambulatory Visit
Admission: RE | Admit: 2023-04-25 | Discharge: 2023-04-25 | Disposition: A | Discharge status: Home / Self Care | Payer: Medicare HMO | Source: Ambulatory Visit | Attending: Oncology | Admitting: Oncology

## 2023-04-25 DIAGNOSIS — Z79811 Long term (current) use of aromatase inhibitors: Secondary | ICD-10-CM | POA: Insufficient documentation

## 2023-04-25 DIAGNOSIS — Z170 Estrogen receptor positive status [ER+]: Secondary | ICD-10-CM | POA: Diagnosis not present

## 2023-04-25 DIAGNOSIS — C50412 Malignant neoplasm of upper-outer quadrant of left female breast: Secondary | ICD-10-CM | POA: Insufficient documentation

## 2023-04-25 DIAGNOSIS — M8589 Other specified disorders of bone density and structure, multiple sites: Secondary | ICD-10-CM | POA: Diagnosis not present

## 2023-04-25 DIAGNOSIS — Z780 Asymptomatic menopausal state: Secondary | ICD-10-CM | POA: Diagnosis not present

## 2023-04-25 DIAGNOSIS — R92323 Mammographic fibroglandular density, bilateral breasts: Secondary | ICD-10-CM | POA: Diagnosis not present

## 2023-04-25 DIAGNOSIS — Z853 Personal history of malignant neoplasm of breast: Secondary | ICD-10-CM | POA: Diagnosis not present

## 2023-07-31 ENCOUNTER — Other Ambulatory Visit: Payer: Self-pay | Admitting: Oncology

## 2023-08-02 ENCOUNTER — Inpatient Hospital Stay: Payer: Medicare HMO | Attending: Oncology | Admitting: Oncology

## 2023-08-02 ENCOUNTER — Encounter: Payer: Self-pay | Admitting: Oncology

## 2023-08-02 VITALS — BP 111/80 | HR 72 | Temp 98.8°F | Resp 16 | Ht 60.0 in | Wt 195.0 lb

## 2023-08-02 DIAGNOSIS — Z170 Estrogen receptor positive status [ER+]: Secondary | ICD-10-CM | POA: Diagnosis not present

## 2023-08-02 DIAGNOSIS — M8580 Other specified disorders of bone density and structure, unspecified site: Secondary | ICD-10-CM | POA: Diagnosis not present

## 2023-08-02 DIAGNOSIS — Z79811 Long term (current) use of aromatase inhibitors: Secondary | ICD-10-CM | POA: Diagnosis not present

## 2023-08-02 DIAGNOSIS — C50412 Malignant neoplasm of upper-outer quadrant of left female breast: Secondary | ICD-10-CM | POA: Diagnosis not present

## 2023-08-02 NOTE — Progress Notes (Signed)
Having hand stiffness since starting letrazole. Left arm swelling at times where she had lymph node removed.

## 2023-08-02 NOTE — Progress Notes (Signed)
[facility]  Telephone:([phone]) [phone] [phone]) [phone]  ID: Jennifer Sanders OB: 01-16-1956  MR#: [id number]  UVO#:[id number]  Patient Care Team: Dorcas Carrow, DO as PCP - General (Family Medicine) Carmina Miller, MD as Consulting Physician (Radiation Oncology) Jeralyn Ruths, MD as Consulting Physician (Oncology) Campbell Lerner, MD as Consulting Physician (General Surgery)  CHIEF COMPLAINT: Stage Ia ER/PR positive, HER-2 negative invasive carcinoma of the upper outer quadrant left breast.  Oncotype DX score low risk 8.  INTERVAL HISTORY: Patient returns to clinic today for routine 37-month evaluation.  She continues to have stiffness in her hands, but this does not affect her day-to-day activity.  She otherwise feels well and is asymptomatic.  She continues to tolerate letrozole without significant side effects.  She has no neurologic complaints.  She denies any recent fevers or illnesses.  She has a good appetite and denies weight loss.  She has no chest pain, shortness of breath, cough, or hemoptysis.  She denies any nausea, vomiting, constipation, or diarrhea.  She has no urinary complaints.  Patient offers no further specific complaints today.  REVIEW OF SYSTEMS:   Review of Systems  Constitutional: Negative.  Negative for fever and malaise/fatigue.  Respiratory: Negative.  Negative for cough, hemoptysis and shortness of breath.   Cardiovascular: Negative.  Negative for chest pain and leg swelling.  Gastrointestinal: Negative.  Negative for abdominal pain.  Genitourinary: Negative.  Negative for dysuria.  Musculoskeletal: Negative.  Negative for back pain.  Skin: Negative.  Negative for rash.  Neurological: Negative.  Negative for dizziness, focal weakness, weakness and headaches.  Psychiatric/Behavioral: Negative.  The patient is not nervous/anxious.     As per HPI. Otherwise, a complete review of systems is negative.  PAST MEDICAL HISTORY: Past  Medical History:  Diagnosis Date   Bilateral carpal tunnel syndrome    Breast cancer (HCC)    left breast   Diverticulosis    found on colonoscopy on 05/24/16   Hypertension    Meralgia paraesthetica, left    Personal history of radiation therapy     PAST SURGICAL HISTORY: Past Surgical History:  Procedure Laterality Date   BREAST BIOPSY Left 12/18/2020   [facility]   BREAST LUMPECTOMY Left 01/01/2021   BREAST LUMPECTOMY,RADIO FREQ LOCALIZER,AXILLARY SENTINEL LYMPH NODE BIOPSY Left 01/01/2021   Procedure: BREAST LUMPECTOMY,RADIO FREQ LOCALIZER,AXILLARY SENTINEL LYMPH NODE BIOPSY;  Surgeon: Campbell Lerner, MD;  Location: [facility] ORS;  Service: General;  Laterality: Left;  Left axilla   CHOLECYSTECTOMY  1990s   COLONOSCOPY  05/24/2016   internal hemorrhoids, diverticulosis   DENTAL SURGERY  2023    FAMILY HISTORY: Family History  Problem Relation Age of Onset   Hyperlipidemia Mother    Cancer Mother        lung   Cancer Father        esophageal   Throat cancer Maternal Grandmother    Breast cancer Neg Hx     ADVANCED DIRECTIVES (Y/N):  N  HEALTH MAINTENANCE: Social History   Tobacco Use   Smoking status: Never   Smokeless tobacco: Never  Vaping Use   Vaping status: Never Used  Substance Use Topics   Alcohol use: No   Drug use: No     Colonoscopy:  PAP:  Bone density:  Lipid panel:  No Known Allergies  Current Outpatient Medications  Medication Sig Dispense Refill   letrozole (FEMARA) 2.5 MG tablet Take 1 tablet (2.5 mg total) by mouth daily. 90 tablet 0   lisinopril-hydrochlorothiazide (ZESTORETIC)  20-12.5 MG tablet Take 1 tablet by mouth daily. 90 tablet 1   pyridoxine (B-6) 200 MG tablet Take 200 mg by mouth daily.     No current facility-administered medications for this visit.    OBJECTIVE: Vitals:   08/02/23 1032  BP: 111/80  Pulse: 72  Resp: 16  Temp: 98.8 F (37.1 C)  SpO2: 100%     Body mass index is 38.08 kg/m.    ECOG FS:0 -  Asymptomatic  General: Well-developed, well-nourished, no acute distress. Eyes: Pink conjunctiva, anicteric sclera. HEENT: Normocephalic, moist mucous membranes. Lungs: No audible wheezing or coughing. Heart: Regular rate and rhythm. Abdomen: Soft, nontender, no obvious distention. Musculoskeletal: No edema, cyanosis, or clubbing. Neuro: Alert, answering all questions appropriately. Cranial nerves grossly intact. Skin: No rashes or petechiae noted. Psych: Normal affect.  LAB RESULTS:  Lab Results  Component Value Date   NA 140 03/24/2023   K 4.0 03/24/2023   CL 101 03/24/2023   CO2 22 03/24/2023   GLUCOSE 106 (H) 03/24/2023   BUN 9 03/24/2023   CREATININE 0.92 03/24/2023   CALCIUM 10.1 03/24/2023   PROT 7.3 03/24/2023   ALBUMIN 4.6 03/24/2023   AST 24 03/24/2023   ALT 16 03/24/2023   ALKPHOS 97 03/24/2023   BILITOT 0.7 03/24/2023   GFRNONAA >60 12/24/2020   GFRAA 77 09/16/2020    Lab Results  Component Value Date   WBC 8.5 03/24/2023   NEUTROABS 6.7 03/24/2023   HGB 14.6 03/24/2023   HCT 41.3 03/24/2023   MCV 87 03/24/2023   PLT 218 03/24/2023     STUDIES: No results found.  ASSESSMENT: Stage Ia ER/PR positive, HER-2 negative invasive carcinoma of the upper outer quadrant left breast.  Oncotype DX score low risk 8.  PLAN:    Stage Ia ER/PR positive, HER-2 negative invasive carcinoma of the upper outer quadrant left breast: Oncotype DX score low risk 8.  Given patient's stage of disease and low risk Oncotype, she did not require adjuvant chemotherapy.  Patient completed XRT on Apr 12, 2021.  Continue letrozole for a total of 5 years completing treatment in May 2027.  Her most recent mammogram on Apr 25, 2023 was reported as BI-RADS 2.  Repeat in May 2025.  Patient will have video-assisted telemedicine visit in 6 months for routine evaluation.   Osteopenia: Patient's most recent bone mineral density on Apr 25, 2023 reported T-score of -2.3 which is slightly worse  than 1 year prior where her T-score was reported -2.0.  Recommended continue calcium and vitamin D supplementation.  Repeat in May 2025 along with mammogram as above.    Hand stiffness: Unrelated to letrozole.  Patient expressed understanding and was in agreement with this plan. She also understands that She can call clinic at any time with any questions, concerns, or complaints.    Cancer Staging  Malignant neoplasm of upper-outer quadrant of left female breast [facility]) Staging form: Breast, AJCC 8th Edition - Pathologic stage from 01/22/2021: Stage IA (pT1c, pN0, cM0, G1, ER+, PR+, HER2-, Oncotype DX score: 8) - Signed by Jeralyn Ruths, MD on 01/22/2021 Stage prefix: Initial diagnosis Multigene prognostic tests performed: Oncotype DX Recurrence score range: Less than 11 Histologic grading system: 3 grade system   Jeralyn Ruths, MD   08/02/2023 3:21 PM

## 2023-09-25 ENCOUNTER — Ambulatory Visit (INDEPENDENT_AMBULATORY_CARE_PROVIDER_SITE_OTHER): Payer: Medicare HMO | Admitting: Family Medicine

## 2023-09-25 ENCOUNTER — Encounter: Payer: Self-pay | Admitting: Family Medicine

## 2023-09-25 VITALS — BP 124/80 | HR 79 | Ht 60.0 in | Wt 192.4 lb

## 2023-09-25 DIAGNOSIS — E785 Hyperlipidemia, unspecified: Secondary | ICD-10-CM

## 2023-09-25 DIAGNOSIS — H6123 Impacted cerumen, bilateral: Secondary | ICD-10-CM | POA: Diagnosis not present

## 2023-09-25 DIAGNOSIS — D692 Other nonthrombocytopenic purpura: Secondary | ICD-10-CM | POA: Diagnosis not present

## 2023-09-25 DIAGNOSIS — I10 Essential (primary) hypertension: Secondary | ICD-10-CM

## 2023-09-25 MED ORDER — LISINOPRIL-HYDROCHLOROTHIAZIDE 20-12.5 MG PO TABS: 1.0000 | ORAL_TABLET | Freq: Every day | ORAL | 1 refills | Status: DC

## 2023-09-25 NOTE — Progress Notes (Signed)
BP 124/80   Pulse 79   Ht 5' (1.524 m)   Wt 192 lb 6.4 oz (87.3 kg)   SpO2 98%   BMI 37.58 kg/m    Subjective:    Patient ID: Jennifer Sanders, female    DOB: 1956-04-02, 67 y.o.   MRN: [id number]  HPI: Jennifer Sanders is a 67 y.o. female  Chief Complaint  Patient presents with   Hypertension   Hyperlipidemia   Ear Fullness    Patient says she would like to have her ears checked at today's visit. Patient says she has tendency of producing a lot of ear wax and says she would like to have them checked.    HYPERTENSION / HYPERLIPIDEMIA Satisfied with current treatment? yes Duration of hypertension: chronic BP monitoring frequency: not checking BP medication side effects: no Past BP meds: lisinopril-HCTZ Duration of hyperlipidemia: chronic Cholesterol medication side effects:N/A Cholesterol supplements: none Past cholesterol medications: none Medication compliance: excellent compliance Aspirin: no Recent stressors: no Recurrent headaches: no Visual changes: no Palpitations: no Dyspnea: no Chest pain: no Lower extremity edema: no Dizzy/lightheaded: no  EAG CLOGGED Duration: weeks Involved ear(s): bilateral Sensation of feeling clogged/plugged: yes Decreased/muffled hearing:yes Ear pain: no Fever: no Otorrhea: no Hearing loss: no Upper respiratory infection symptoms: no Using Q-Tips: no Status: stable History of cerumenosis: yes Treatments attempted: none   Relevant past medical, surgical, family and social history reviewed and updated as indicated. Interim medical history since our last visit reviewed. Allergies and medications reviewed and updated.  Review of Systems  Constitutional: Negative.   Respiratory: Negative.    Cardiovascular: Negative.   Gastrointestinal: Negative.   Musculoskeletal: Negative.   Psychiatric/Behavioral: Negative.      Per HPI unless specifically indicated above     Objective:    BP 124/80   Pulse 79   Ht 5' (1.524  m)   Wt 192 lb 6.4 oz (87.3 kg)   SpO2 98%   BMI 37.58 kg/m   Wt Readings from Last 3 Encounters:  09/25/23 192 lb 6.4 oz (87.3 kg)  08/02/23 195 lb (88.5 kg)  03/24/23 188 lb 9.6 oz (85.5 kg)    Physical Exam Vitals and nursing note reviewed.  Constitutional:      General: She is not in acute distress.    Appearance: Normal appearance. She is obese. She is not ill-appearing, toxic-appearing or diaphoretic.  HENT:     Head: Normocephalic and atraumatic.     Right Ear: External ear normal. There is impacted cerumen.     Left Ear: External ear normal. There is impacted cerumen.     Nose: Nose normal.     Mouth/Throat:     Mouth: Mucous membranes are moist.     Pharynx: Oropharynx is clear.  Eyes:     General: No scleral icterus.       Right eye: No discharge.        Left eye: No discharge.     Extraocular Movements: Extraocular movements intact.     Conjunctiva/sclera: Conjunctivae normal.     Pupils: Pupils are equal, round, and reactive to light.  Cardiovascular:     Rate and Rhythm: Normal rate and regular rhythm.     Pulses: Normal pulses.     Heart sounds: Normal heart sounds. No murmur heard.    No friction rub. No gallop.  Pulmonary:     Effort: Pulmonary effort is normal. No respiratory distress.     Breath sounds: Normal breath sounds. No  stridor. No wheezing, rhonchi or rales.  Chest:     Chest wall: No tenderness.  Musculoskeletal:        General: Normal range of motion.     Cervical back: Normal range of motion and neck supple.     Comments: Swelling in L hand, triggering L thumb  Skin:    General: Skin is warm and dry.     Capillary Refill: Capillary refill takes less than 2 seconds.     Coloration: Skin is not jaundiced or pale.     Findings: No bruising, erythema, lesion or rash.  Neurological:     General: No focal deficit present.     Mental Status: She is alert and oriented to person, place, and time. Mental status is at baseline.  Psychiatric:         Mood and Affect: Mood normal.        Behavior: Behavior normal.        Thought Content: Thought content normal.        Judgment: Judgment normal.     Results for orders placed or performed in visit on 03/24/23  CBC with Differential/Platelet  Result Value Ref Range   WBC 8.5 3.4 - 10.8 x10E3/uL   RBC 4.76 3.77 - 5.28 x10E6/uL   Hemoglobin 14.6 11.1 - 15.9 g/dL   Hematocrit 66.4 40.3 - 46.6 %   MCV 87 79 - 97 fL   MCH 30.7 26.6 - 33.0 pg   MCHC 35.4 31.5 - 35.7 g/dL   RDW 47.4 25.9 - 56.3 %   Platelets 218 150 - 450 x10E3/uL   Neutrophils 78 Not Estab. %   Lymphs 14 Not Estab. %   Monocytes 7 Not Estab. %   Eos 1 Not Estab. %   Basos 0 Not Estab. %   Neutrophils Absolute 6.7 1.4 - 7.0 x10E3/uL   Lymphocytes Absolute 1.2 0.7 - 3.1 x10E3/uL   Monocytes Absolute 0.6 0.1 - 0.9 x10E3/uL   EOS (ABSOLUTE) 0.1 0.0 - 0.4 x10E3/uL   Basophils Absolute 0.0 0.0 - 0.2 x10E3/uL   Immature Granulocytes 0 Not Estab. %   Immature Grans (Abs) 0.0 0.0 - 0.1 x10E3/uL  Comprehensive metabolic panel  Result Value Ref Range   Glucose 106 (H) 70 - 99 mg/dL   BUN 9 8 - 27 mg/dL   Creatinine, Ser 8.75 0.57 - 1.00 mg/dL   eGFR 69 >64 PP/IRJ/1.88   BUN/Creatinine Ratio 10 (L) 12 - 28   Sodium 140 134 - 144 mmol/L   Potassium 4.0 3.5 - 5.2 mmol/L   Chloride 101 96 - 106 mmol/L   CO2 22 20 - 29 mmol/L   Calcium 10.1 8.7 - 10.3 mg/dL   Total Protein 7.3 6.0 - 8.5 g/dL   Albumin 4.6 3.9 - 4.9 g/dL   Globulin, Total 2.7 1.5 - 4.5 g/dL   Albumin/Globulin Ratio 1.7 1.2 - 2.2   Bilirubin Total 0.7 0.0 - 1.2 mg/dL   Alkaline Phosphatase 97 44 - 121 IU/L   AST 24 0 - 40 IU/L   ALT 16 0 - 32 IU/L  Lipid Panel w/o Chol/HDL Ratio  Result Value Ref Range   Cholesterol, Total 176 100 - 199 mg/dL   Triglycerides 416 0 - 149 mg/dL   HDL 61 >60 mg/dL   VLDL Cholesterol Cal 20 5 - 40 mg/dL   LDL Chol Calc (NIH) 95 0 - 99 mg/dL  Urinalysis, Routine w reflex microscopic  Result Value Ref Range  Specific Gravity, UA <1.005 (L) 1.005 - 1.030   pH, UA 5.5 5.0 - 7.5   Color, UA Yellow Yellow   Appearance Ur Clear Clear   Leukocytes,UA Negative Negative   Protein,UA Negative Negative/Trace   Glucose, UA Negative Negative   Ketones, UA Negative Negative   RBC, UA Negative Negative   Bilirubin, UA Negative Negative   Urobilinogen, Ur 0.2 0.2 - 1.0 mg/dL   Nitrite, UA Negative Negative   Microscopic Examination Comment   TSH  Result Value Ref Range   TSH 2.320 0.450 - 4.500 uIU/mL  Microalbumin, Urine Waived  Result Value Ref Range   Microalb, Ur Waived 10 0 - 19 mg/L   Creatinine, Urine Waived 50 10 - 300 mg/dL   Microalb/Creat Ratio 30-300 (H) <30 mg/g      Assessment & Plan:   Problem List Items Addressed This Visit       Cardiovascular and Mediastinum   Hypertension - Primary    Under good control on current regimen. Continue current regimen. Continue to monitor. Call with any concerns. Refills given. Labs drawn today.        Relevant Medications   lisinopril-hydrochlorothiazide (ZESTORETIC) 20-12.5 MG tablet   Other Relevant Orders   Comprehensive metabolic panel   Senile purpura (HCC)    Stable. Rechecking labs today. Await results.       Relevant Medications   lisinopril-hydrochlorothiazide (ZESTORETIC) 20-12.5 MG tablet   Other Relevant Orders   Comprehensive metabolic panel   CBC with Differential/Platelet     Other   Hyperlipidemia    Labs drawn today. Await results. Treat as needed.       Relevant Medications   lisinopril-hydrochlorothiazide (ZESTORETIC) 20-12.5 MG tablet   Other Relevant Orders   Comprehensive metabolic panel   Lipid Panel w/o Chol/HDL Ratio   Other Visit Diagnoses     Bilateral impacted cerumen       Ears flushed today with good results.        Follow up plan: Return in about 6 months (around 03/25/2024) for physical.

## 2023-09-25 NOTE — Assessment & Plan Note (Addendum)
Labs drawn today. Await results. Treat as needed.  

## 2023-09-25 NOTE — Assessment & Plan Note (Signed)
Under good control on current regimen. Continue current regimen. Continue to monitor. Call with any concerns. Refills given. Labs drawn today.   

## 2023-09-25 NOTE — Assessment & Plan Note (Signed)
Stable. Rechecking labs today. Await results.  

## 2023-09-26 LAB — COMPREHENSIVE METABOLIC PANEL
ALT: 18 [IU]/L (ref 0–32)
AST: 24 [IU]/L (ref 0–40)
Albumin: 5 g/dL — ABNORMAL HIGH (ref 3.9–4.9)
Alkaline Phosphatase: 89 [IU]/L (ref 44–121)
BUN/Creatinine Ratio: 13 (ref 12–28)
BUN: 11 mg/dL (ref 8–27)
Bilirubin Total: 0.5 mg/dL (ref 0.0–1.2)
CO2: 21 mmol/L (ref 20–29)
Calcium: 10.2 mg/dL (ref 8.7–10.3)
Chloride: 98 mmol/L (ref 96–106)
Creatinine, Ser: 0.88 mg/dL (ref 0.57–1.00)
Globulin, Total: 2.6 g/dL (ref 1.5–4.5)
Glucose: 101 mg/dL — ABNORMAL HIGH (ref 70–99)
Potassium: 4.2 mmol/L (ref 3.5–5.2)
Sodium: 140 mmol/L (ref 134–144)
Total Protein: 7.6 g/dL (ref 6.0–8.5)
eGFR: 72 mL/min/{1.73_m2} (ref >59)

## 2023-09-26 LAB — CBC WITH DIFFERENTIAL/PLATELET
Basophils Absolute: 0 10*3/uL (ref 0.0–0.2)
Basos: 1 %
EOS (ABSOLUTE): 0.1 10*3/uL (ref 0.0–0.4)
Eos: 1 %
Hematocrit: 42.5 % (ref 34.0–46.6)
Hemoglobin: 15.2 g/dL (ref 11.1–15.9)
Immature Grans (Abs): 0 10*3/uL (ref 0.0–0.1)
Immature Granulocytes: 0 %
Lymphocytes Absolute: 1.3 10*3/uL (ref 0.7–3.1)
Lymphs: 16 %
MCH: 31.3 pg (ref 26.6–33.0)
MCHC: 35.8 g/dL — ABNORMAL HIGH (ref 31.5–35.7)
MCV: 87 fL (ref 79–97)
Monocytes Absolute: 0.6 10*3/uL (ref 0.1–0.9)
Monocytes: 8 %
Neutrophils Absolute: 5.9 10*3/uL (ref 1.4–7.0)
Neutrophils: 74 %
Platelets: 222 10*3/uL (ref 150–450)
RBC: 4.86 x10E6/uL (ref 3.77–5.28)
RDW: 13.5 % (ref 11.7–15.4)
WBC: 8 10*3/uL (ref 3.4–10.8)

## 2023-09-26 LAB — LIPID PANEL W/O CHOL/HDL RATIO
Cholesterol, Total: 219 mg/dL — ABNORMAL HIGH (ref 100–199)
HDL: 62 mg/dL (ref >39)
LDL Chol Calc (NIH): 130 mg/dL — ABNORMAL HIGH (ref 0–99)
Triglycerides: 151 mg/dL — ABNORMAL HIGH (ref 0–149)
VLDL Cholesterol Cal: 27 mg/dL (ref 5–40)

## 2023-10-12 ENCOUNTER — Ambulatory Visit
Admission: RE | Admit: 2023-10-12 | Discharge: 2023-10-12 | Disposition: A | Discharge status: Home / Self Care | Payer: Medicare HMO | Source: Ambulatory Visit | Attending: Radiation Oncology | Admitting: Radiation Oncology

## 2023-10-12 ENCOUNTER — Encounter: Payer: Self-pay | Admitting: Radiation Oncology

## 2023-10-12 VITALS — BP 123/80 | HR 83 | Temp 98.5°F | Ht 60.0 in | Wt 194.9 lb

## 2023-10-12 DIAGNOSIS — Z923 Personal history of irradiation: Secondary | ICD-10-CM | POA: Diagnosis not present

## 2023-10-12 DIAGNOSIS — Z170 Estrogen receptor positive status [ER+]: Secondary | ICD-10-CM | POA: Diagnosis not present

## 2023-10-12 DIAGNOSIS — C50412 Malignant neoplasm of upper-outer quadrant of left female breast: Secondary | ICD-10-CM | POA: Insufficient documentation

## 2023-10-12 DIAGNOSIS — M7989 Other specified soft tissue disorders: Secondary | ICD-10-CM | POA: Insufficient documentation

## 2023-10-12 DIAGNOSIS — Z79811 Long term (current) use of aromatase inhibitors: Secondary | ICD-10-CM | POA: Insufficient documentation

## 2023-10-12 NOTE — Progress Notes (Signed)
Radiation Oncology Follow up Note  Name: Jennifer Sanders   Date:   10/12/2023 MRN:  [id number] DOB: Nov 17, 1956    This 67 y.o. female presents to the clinic today for 2-1/2-year follow-up status post whole breast radiation for ER/PR positive invasive mammary carcinoma.  REFERRING PROVIDER: Dorcas Carrow, DO  HPI: The patient, a 67 year old with a history of stage 1 ER/PR positive invasive mammary carcinoma of the left breast, presents with left hand swelling. She completed whole breast radiation two and a half years ago and has been on Femara since, without side effects. Her most recent mammogram in May was benign (BI-RADS 2) and she is scheduled for yearly mammograms.  The patient reports swelling on the left side where one lymph node was tested but found to be negative. She was concerned about lymphedema, but the doctor reassured her that the swelling is likely due to arthritis, not lymphedema..  COMPLICATIONS OF TREATMENT: none  FOLLOW UP COMPLIANCE: keeps appointments   PHYSICAL EXAM:  BP 123/80   Pulse 83   Temp 98.5 F (36.9 C)   Ht 5' (1.524 m)   Wt 194 lb 14.4 oz (88.4 kg)   BMI 38.06 kg/m  Lungs are clear to A&P cardiac examination essentially unremarkable with regular rate and rhythm. No dominant mass or nodularity is noted in either breast in 2 positions examined. Incision is well-healed. No axillary or supraclavicular adenopathy is appreciated. Cosmetic result is excellent.  Well-developed well-nourished patient in NAD. HEENT reveals PERLA, EOMI, discs not visualized.  Oral cavity is clear. No oral mucosal lesions are identified. Neck is clear without evidence of cervical or supraclavicular adenopathy. Lungs are clear to A&P. Cardiac examination is essentially unremarkable with regular rate and rhythm without murmur rub or thrill. Abdomen is benign with no organomegaly or masses noted. Motor sensory and DTR levels are equal and symmetric in the upper and lower extremities.  Cranial nerves II through XII are grossly intact. Proprioception is intact. No peripheral adenopathy or edema is identified. No motor or sensory levels are noted. Crude visual fields are within normal range.  RADIOLOGY RESULTS: No current films for review  PLAN: Stage 1 ER/PR positive invasive mammary carcinoma Patient is 2.5 years post-treatment with whole breast radiation. Mammogram in May was benign (BI-RADS 2). Patient is currently on Anastrozole without side effects. No signs of recurrence. -Continue Anastrozole. -Continue yearly mammograms.  Localized hand swelling Patient reports localized hand swelling. Assessed as not lymphedema but likely arthritis. -Advise cold compress at night. -Dispel patient's concern about lymphedema.  Follow-up plan -Plan to see patient in one year, then discontinue follow-ups as patient is doing well post-treatment.    Carmina Miller, MD

## 2024-01-30 ENCOUNTER — Telehealth: Payer: Medicare HMO | Admitting: Oncology

## 2024-01-30 ENCOUNTER — Inpatient Hospital Stay: Payer: Medicare HMO | Attending: Oncology | Admitting: Oncology

## 2024-01-30 DIAGNOSIS — Z170 Estrogen receptor positive status [ER+]: Secondary | ICD-10-CM

## 2024-01-30 DIAGNOSIS — C50412 Malignant neoplasm of upper-outer quadrant of left female breast: Secondary | ICD-10-CM

## 2024-01-30 DIAGNOSIS — Z79811 Long term (current) use of aromatase inhibitors: Secondary | ICD-10-CM

## 2024-01-30 MED ORDER — LETROZOLE 2.5 MG PO TABS: 2.5000 mg | ORAL_TABLET | Freq: Every day | ORAL | 3 refills | Status: AC

## 2024-01-30 NOTE — Progress Notes (Signed)
 [facility]  [phone] [phone]  ID: Jennifer Sanders OB: 1956-09-12  MR#: [id number]  QIO#:[id number]  Patient Care Team: Dorcas Carrow, DO as PCP - General (Family Medicine) Carmina Miller, MD as Consulting Physician (Radiation Oncology) Jeralyn Ruths, MD as Consulting Physician (Oncology) Campbell Lerner, MD as Consulting Physician (General Surgery)  I connected with [name] on 01/30/24 at 10:00 AM EST by video enabled telemedicine visit and verified that I am speaking with the correct person using two identifiers.   I discussed the limitations, risks, security and privacy concerns of performing an evaluation and management service by telemedicine and the availability of in-person appointments. I also discussed with the patient that there may be a patient responsible charge related to this service. The patient expressed understanding and agreed to proceed.   Other persons participating in the visit and their role in the encounter: Patient, MD.  Patient's location: Home. Provider's location: Clinic.  CHIEF COMPLAINT: Stage Ia ER/PR positive, HER-2 negative invasive carcinoma of the upper outer quadrant left breast.  Oncotype DX score low risk 8.  INTERVAL HISTORY: Patient agreed to video-assisted telemedicine visit for routine 21-month evaluation.  She currently feels well and is asymptomatic.  She is tolerating letrozole without significant side effects. She has no neurologic complaints.  She denies any recent fevers or illnesses.  She has a good appetite and denies weight loss.  She has no chest pain, shortness of breath, cough, or hemoptysis.  She denies any nausea, vomiting, constipation, or diarrhea.  She has no urinary complaints.  Patient offers no specific complaints today.  REVIEW OF SYSTEMS:   Review of Systems  Constitutional: Negative.  Negative for fever and malaise/fatigue.  Respiratory: Negative.  Negative for  cough, hemoptysis and shortness of breath.   Cardiovascular: Negative.  Negative for chest pain and leg swelling.  Gastrointestinal: Negative.  Negative for abdominal pain.  Genitourinary: Negative.  Negative for dysuria.  Musculoskeletal: Negative.  Negative for back pain.  Skin: Negative.  Negative for rash.  Neurological: Negative.  Negative for dizziness, focal weakness, weakness and headaches.  Psychiatric/Behavioral: Negative.  The patient is not nervous/anxious.     As per HPI. Otherwise, a complete review of systems is negative.  PAST MEDICAL HISTORY: Past Medical History:  Diagnosis Date   Bilateral carpal tunnel syndrome    Breast cancer (HCC)    left breast   Diverticulosis    found on colonoscopy on 05/24/16   Hypertension    Meralgia paraesthetica, left    Personal history of radiation therapy     PAST SURGICAL HISTORY: Past Surgical History:  Procedure Laterality Date   BREAST BIOPSY Left 12/18/2020   [facility]   BREAST LUMPECTOMY Left 01/01/2021   BREAST LUMPECTOMY,RADIO FREQ LOCALIZER,AXILLARY SENTINEL LYMPH NODE BIOPSY Left 01/01/2021   Procedure: BREAST LUMPECTOMY,RADIO FREQ LOCALIZER,AXILLARY SENTINEL LYMPH NODE BIOPSY;  Surgeon: Campbell Lerner, MD;  Location: [facility] ORS;  Service: General;  Laterality: Left;  Left axilla   CHOLECYSTECTOMY  1990s   COLONOSCOPY  05/24/2016   internal hemorrhoids, diverticulosis   DENTAL SURGERY  2023    FAMILY HISTORY: Family History  Problem Relation Age of Onset   Hyperlipidemia Mother    Cancer Mother        lung   Cancer Father        esophageal   Throat cancer Maternal Grandmother    Breast cancer Neg Hx     ADVANCED DIRECTIVES (Y/N):  N  HEALTH MAINTENANCE: Social  History   Tobacco Use   Smoking status: Never   Smokeless tobacco: Never  Vaping Use   Vaping status: Never Used  Substance Use Topics   Alcohol use: No   Drug use: No     Colonoscopy:  PAP:  Bone density:  Lipid panel:  No Known  Allergies  Current Outpatient Medications  Medication Sig Dispense Refill   letrozole (FEMARA) 2.5 MG tablet Take 1 tablet (2.5 mg total) by mouth daily. 90 tablet 0   lisinopril-hydrochlorothiazide (ZESTORETIC) 20-12.5 MG tablet Take 1 tablet by mouth daily. 90 tablet 1   pyridoxine (B-6) 200 MG tablet Take 200 mg by mouth daily.     No current facility-administered medications for this visit.    OBJECTIVE: There were no vitals filed for this visit.    There is no height or weight on file to calculate BMI.    ECOG FS:0 - Asymptomatic  General: Well-developed, well-nourished, no acute distress. HEENT: Normocephalic. Neuro: Alert, answering all questions appropriately. Cranial nerves grossly intact. Psych: Normal affect.  LAB RESULTS:  Lab Results  Component Value Date   NA 140 09/25/2023   K 4.2 09/25/2023   CL 98 09/25/2023   CO2 21 09/25/2023   GLUCOSE 101 (H) 09/25/2023   BUN 11 09/25/2023   CREATININE 0.88 09/25/2023   CALCIUM 10.2 09/25/2023   PROT 7.6 09/25/2023   ALBUMIN 5.0 (H) 09/25/2023   AST 24 09/25/2023   ALT 18 09/25/2023   ALKPHOS 89 09/25/2023   BILITOT 0.5 09/25/2023   GFRNONAA >60 12/24/2020   GFRAA 77 09/16/2020    Lab Results  Component Value Date   WBC 8.0 09/25/2023   NEUTROABS 5.9 09/25/2023   HGB 15.2 09/25/2023   HCT 42.5 09/25/2023   MCV 87 09/25/2023   PLT 222 09/25/2023     STUDIES: No results found.  ASSESSMENT: Stage Ia ER/PR positive, HER-2 negative invasive carcinoma of the upper outer quadrant left breast.  Oncotype DX score low risk 8.  PLAN:    Stage Ia ER/PR positive, HER-2 negative invasive carcinoma of the upper outer quadrant left breast: Oncotype DX score low risk 8.  Given patient's stage of disease and low risk Oncotype, she did not require adjuvant chemotherapy.  Patient completed XRT on Apr 12, 2021.  Continue letrozole for a total of 5 years completing treatment in May 2027.  Her most recent mammogram on Apr 25, 2023 was reported as BI-RADS 2.  Repeat mammogram in May 2025.  Return to clinic in 6 months with video-assisted telemedicine visit. Osteopenia: Patient's most recent bone mineral density on Apr 25, 2023 reported T-score of -2.3 which is slightly worse than 1 year prior where her T-score was reported -2.0.  Continue calcium and vitamin D supplementation.  Repeat bone mineral density in May 2025 along with mammogram as above.   Hand stiffness: Unrelated to letrozole.  Per patient, symptoms resolved with calcium, vitamin D, and magnesium supplementation.  I provided 20 minutes of face-to-face video visit time during this encounter which included chart review, counseling, and coordination of care as documented above.   Patient expressed understanding and was in agreement with this plan. She also understands that She can call clinic at any time with any questions, concerns, or complaints.    Cancer Staging  Malignant neoplasm of upper-outer quadrant of left female breast [facility]) Staging form: Breast, AJCC 8th Edition - Pathologic stage from 01/22/2021: Stage IA (pT1c, pN0, cM0, G1, ER+, PR+, HER2-, Oncotype DX score: 8) -  Signed by Jeralyn Ruths, MD on 01/22/2021 Stage prefix: Initial diagnosis Multigene prognostic tests performed: Oncotype DX Recurrence score range: Less than 11 Histologic grading system: 3 grade system   Jeralyn Ruths, MD   01/30/2024 10:09 AM

## 2024-03-25 ENCOUNTER — Ambulatory Visit (INDEPENDENT_AMBULATORY_CARE_PROVIDER_SITE_OTHER): Payer: Self-pay | Admitting: Family Medicine

## 2024-03-25 ENCOUNTER — Encounter: Payer: Self-pay | Admitting: Family Medicine

## 2024-03-25 VITALS — BP 135/73 | HR 76 | Ht 60.0 in | Wt 189.0 lb

## 2024-03-25 DIAGNOSIS — D692 Other nonthrombocytopenic purpura: Secondary | ICD-10-CM

## 2024-03-25 DIAGNOSIS — R052 Subacute cough: Secondary | ICD-10-CM

## 2024-03-25 DIAGNOSIS — I10 Essential (primary) hypertension: Secondary | ICD-10-CM | POA: Diagnosis not present

## 2024-03-25 DIAGNOSIS — Z23 Encounter for immunization: Secondary | ICD-10-CM | POA: Diagnosis not present

## 2024-03-25 DIAGNOSIS — S81802A Unspecified open wound, left lower leg, initial encounter: Secondary | ICD-10-CM

## 2024-03-25 DIAGNOSIS — Z0000 Encounter for general adult medical examination without abnormal findings: Secondary | ICD-10-CM

## 2024-03-25 DIAGNOSIS — E785 Hyperlipidemia, unspecified: Secondary | ICD-10-CM

## 2024-03-25 DIAGNOSIS — C50412 Malignant neoplasm of upper-outer quadrant of left female breast: Secondary | ICD-10-CM | POA: Diagnosis not present

## 2024-03-25 DIAGNOSIS — Z170 Estrogen receptor positive status [ER+]: Secondary | ICD-10-CM | POA: Diagnosis not present

## 2024-03-25 LAB — MICROALBUMIN, URINE WAIVED
Creatinine, Urine Waived: 50 mg/dL (ref 10–300)
Microalb, Ur Waived: 10 mg/L (ref 0–19)

## 2024-03-25 MED ORDER — ALBUTEROL SULFATE HFA 108 (90 BASE) MCG/ACT IN AERS: 2.0000 | INHALATION_SPRAY | Freq: Four times a day (QID) | RESPIRATORY_TRACT | 0 refills | Status: DC | PRN

## 2024-03-25 MED ORDER — LISINOPRIL-HYDROCHLOROTHIAZIDE 20-12.5 MG PO TABS: 1.0000 | ORAL_TABLET | Freq: Every day | ORAL | 1 refills | Status: DC

## 2024-03-25 MED ORDER — SPACER/AERO-HOLDING CHAMBERS DEVI: 0 refills | Status: DC

## 2024-03-25 NOTE — Assessment & Plan Note (Signed)
 Reassured patient. Continue to monitor. Call with any concerns.

## 2024-03-25 NOTE — Patient Instructions (Signed)
  Ms. Arrington , Thank you for taking time to come for your Medicare Wellness Visit. I appreciate your ongoing commitment to your health goals. Please review the following plan we discussed and let me know if I can assist you in the future.   These are the goals we discussed:  Goals      Weight (lb) < 200 lb (90.7 kg)     Increase activity         This is a list of the screening recommended for you and due dates:  Health Maintenance  Topic Date Due   Zoster (Shingles) Vaccine (1 of 2) 09/21/1975   COVID-19 Vaccine (7 - Moderna risk 2024-25 season) 07/29/2024*   Mammogram  04/24/2024   Flu Shot  06/28/2024   DTaP/Tdap/Td vaccine (2 - Td or Tdap) 08/25/2024   Medicare Annual Wellness Visit  03/25/2025   Colon Cancer Screening  05/24/2026   Pneumonia Vaccine  Completed   DEXA scan (bone density measurement)  Completed   Hepatitis C Screening  Completed   HPV Vaccine  Aged Out   Meningitis B Vaccine  Aged Out  *Topic was postponed. The date shown is not the original due date.

## 2024-03-25 NOTE — Assessment & Plan Note (Signed)
 Continue to follow with oncology. Call with any concerns. Continue to monitor.

## 2024-03-25 NOTE — Progress Notes (Signed)
 Subjective:   Jennifer Sanders is a 68 y.o. female who presents for Medicare Annual (Subsequent) preventive examination.  Visit Complete: In person  Patient Medicare AWV questionnaire was completed by the patient on 03/25/24; I have confirmed that all information answered by patient is correct and no changes since this date.  Cardiac Risk Factors include: advanced age (>39men, >31 women)     Objective:    Today's Vitals   03/25/24 1053  BP: 135/73  Pulse: 76  SpO2: 98%  Weight: 189 lb (85.7 kg)  Height: 5' (1.524 m)  PainSc: 0-No pain   Body mass index is 36.91 kg/m.     03/25/2024   10:35 AM 08/02/2023   10:37 AM 08/11/2022   10:51 AM 04/13/2022   11:23 AM 01/24/2022   10:30 AM 01/18/2022   10:35 AM 10/13/2021   11:33 AM  Advanced Directives  Does Patient Have a Medical Advance Directive? No No Yes No No Yes Yes  Type of Engineer, drilling of [place];Living will  Copy of Healthcare Power of Attorney in Chart?      Yes - validated most recent copy scanned in chart (See row information)   Would patient like information on creating a medical advance directive? No - Patient declined No - Patient declined  No - Patient declined No - Patient declined      Current Medications (verified) Outpatient Encounter Medications as of 03/25/2024  Medication Sig   albuterol (VENTOLIN HFA) 108 (90 Base) MCG/ACT inhaler Inhale 2 puffs into the lungs every 6 (six) hours as needed for wheezing or shortness of breath.   letrozole  (FEMARA ) 2.5 MG tablet Take 1 tablet (2.5 mg total) by mouth daily.   pyridoxine (B-6) 200 MG tablet Take 200 mg by mouth daily.   Spacer/Aero-Holding Jennifer Sanders Use as directed with inhaler. Dx: cough   [DISCONTINUED] lisinopril -hydrochlorothiazide  (ZESTORETIC ) 20-12.5 MG tablet Take 1 tablet by mouth daily.   lisinopril -hydrochlorothiazide  (ZESTORETIC ) 20-12.5 MG tablet Take 1 tablet by mouth daily.   No  facility-administered encounter medications on file as of 03/25/2024.    Allergies (verified) Patient has no known allergies.   History: Past Medical History:  Diagnosis Date   Bilateral carpal tunnel syndrome    Breast cancer (HCC)    left breast   Diverticulosis    found on colonoscopy on 05/24/16   Hypertension    Meralgia paraesthetica, left    Personal history of radiation therapy    Past Surgical History:  Procedure Laterality Date   BREAST BIOPSY Left 12/18/2020   [facility]   BREAST LUMPECTOMY Left 01/01/2021   BREAST LUMPECTOMY,RADIO FREQ LOCALIZER,AXILLARY SENTINEL LYMPH NODE BIOPSY Left 01/01/2021   Procedure: BREAST LUMPECTOMY,RADIO FREQ LOCALIZER,AXILLARY SENTINEL LYMPH NODE BIOPSY;  Surgeon: Jennifer Hylan, MD;  Location: [facility] ORS;  Service: General;  Laterality: Left;  Left axilla   CHOLECYSTECTOMY  1990s   COLONOSCOPY  05/24/2016   internal hemorrhoids, diverticulosis   DENTAL SURGERY  2023   Family History  Problem Relation Age of Onset   Hyperlipidemia Mother    Cancer Mother        lung   Cancer Father        esophageal   Throat cancer Maternal Grandmother    Breast cancer Neg Hx    Social History   Socioeconomic History   Marital status: Widowed    Spouse name: Not on file   Number of children: Not on file  Years of education: Not on file   Highest education level: Associate degree: academic program  Occupational History   Not on file  Tobacco Use   Smoking status: Never   Smokeless tobacco: Never  Vaping Use   Vaping status: Never Used  Substance and Sexual Activity   Alcohol use: No   Drug use: No   Sexual activity: Yes  Other Topics Concern   Not on file  Social History Narrative   Not on file   Social Drivers of Health   Financial Resource Strain: Low Risk  (03/21/2024)   Overall Financial Resource Strain (CARDIA)    Difficulty of Paying Living Expenses: Not very hard  Food Insecurity: No Food Insecurity (03/21/2024)   Hunger Vital  Sign    Worried About Running Out of Food in the Last Year: Never true    Ran Out of Food in the Last Year: Never true  Transportation Needs: No Transportation Needs (03/21/2024)   PRAPARE - Administrator, Civil Service (Medical): No    Lack of Transportation (Non-Medical): No  Physical Activity: Insufficiently Active (03/21/2024)   Exercise Vital Sign    Days of Exercise per Week: 1 day    Minutes of Exercise per Session: 10 min  Stress: No Stress Concern Present (03/21/2024)   [employer] of Occupational Health - Occupational Stress Questionnaire    Feeling of Stress : Only a little  Social Connections: Socially Isolated (03/21/2024)   Social Connection and Isolation Panel [NHANES]    Frequency of Communication with Friends and Family: Twice a week    Frequency of Social Gatherings with Friends and Family: Once a week    Attends Religious Services: Never    Database administrator or Organizations: No    Attends Engineer, structural: Not on file    Marital Status: Widowed    Tobacco Counseling Counseling given: Not Answered   Clinical Intake:     Pain Score: 0-No pain                  Activities of Daily Living    03/25/2024   10:34 AM  In your present state of health, do you have any difficulty performing the following activities:  Hearing? 0  Vision? 0  Difficulty concentrating or making decisions? 0  Walking or climbing stairs? 0  Dressing or bathing? 0  Doing errands, shopping? 0  Preparing Food and eating ? N  Using the Toilet? N  In the past six months, have you accidently leaked urine? N  Do you have problems with loss of bowel control? N  Managing your Medications? N  Managing your Finances? N  Housekeeping or managing your Housekeeping? N    Patient Care Team: Jennifer Dupre, DO as PCP - General (Family Medicine) Jennifer Langdon, MD as Consulting Physician (Radiation Oncology) Jennifer Dials, MD as Consulting  Physician (Oncology) Jennifer Hylan, MD as Consulting Physician (General Surgery)  Indicate any recent Medical Services you may have received from other than [facility] providers in the past year (date may be approximate).     Assessment:   This is a routine wellness examination for Jennifer Sanders.  Hearing/Vision screen No results found.   Goals Addressed   None   Depression Screen    03/25/2024   10:25 AM 03/24/2023   10:23 AM 09/21/2022   10:17 AM 03/21/2022   10:36 AM 01/18/2022   11:06 AM 01/18/2022   10:36 AM 09/17/2021   10:04 AM  PHQ 2/9 Scores  PHQ - 2 Score 0 0 0 0 0 0 0  PHQ- 9 Score 0 0 0 0   0    Fall Risk    03/25/2024   10:35 AM 03/24/2023   10:23 AM 09/21/2022   10:17 AM 03/21/2022   10:19 AM 01/18/2022   11:07 AM  Fall Risk   Falls in the past year? 0 0 0 0 0  Number falls in past yr: 0 0 0 0 0  Injury with Fall? 0 0 0 0 0  Risk for fall due to :  No Fall Risks No Fall Risks No Fall Risks   Follow up  Falls evaluation completed Falls evaluation completed Falls evaluation completed Falls evaluation completed;Falls prevention discussed;Education provided    MEDICARE RISK AT HOME: Medicare Risk at Home Any stairs in or around the home?: Yes If so, are there any without handrails?: Yes Home free of loose throw rugs in walkways, pet beds, electrical cords, etc?: No Adequate lighting in your home to reduce risk of falls?: Yes Life alert?: No Use of a cane, walker or w/c?: No Grab bars in the bathroom?: Yes Shower chair or bench in shower?: No Elevated toilet seat or a handicapped toilet?: No  TIMED UP AND GO:  Was the test performed?  Yes  Length of time to ambulate 10 feet: 8 sec Gait steady and fast without use of assistive device    Cognitive Function:        03/25/2024   10:31 AM 03/24/2023   10:51 AM 03/21/2022   10:44 AM  6CIT Screen  What Year? 0 points 0 points 0 points  What month? 0 points 0 points 0 points  What time? 0 points 0 points 0 points   Count back from 20 0 points 0 points 0 points  Months in reverse 0 points 0 points 0 points  Repeat phrase 2 points 2 points 8 points  Total Score 2 points 2 points 8 points    Immunizations Immunization History  Administered Date(s) Administered   Fluad Quad(high Dose 65+) 09/21/2022, 08/09/2023   Influenza, Seasonal, Injecte, Preservative Fre 08/25/2014   Influenza,inj,Quad PF,6+ Mos 09/14/2015, 09/10/2020   Influenza-Unspecified 09/20/1957, 08/21/2013, 09/21/2016, 09/06/2017, 09/23/2018, 08/24/2021   Moderna Covid-19 Fall Seasonal Vaccine 59yrs & older 09/05/2023   Moderna Sars-Covid-2 Vaccination 02/01/2020, 02/29/2020, 09/28/2020, 04/19/2021   PNEUMOCOCCAL CONJUGATE-20 03/21/2022   Tdap 08/25/2014   Zoster, Live 09/14/2015    TD status: Completed at today's visit  Flu Vaccine status: Up to date  Pneumococcal vaccine status: Up to date  Covid-19 vaccine status: Completed vaccines  Qualifies for Shingles Vaccine? Yes   Zostavax completed No   Shingrix Completed?: No.    Education has been provided regarding the importance of this vaccine. Patient has been advised to call insurance company to determine out of pocket expense if they have not yet received this vaccine. Advised may also receive vaccine at local pharmacy or Health Dept. Verbalized acceptance and understanding.  Screening Tests Health Maintenance  Topic Date Due   Zoster Vaccines- Shingrix (1 of 2) 09/21/1975   COVID-19 Vaccine (7 - Moderna risk 2024-25 season) 07/29/2024 (Originally 03/05/2024)   MAMMOGRAM  04/24/2024   INFLUENZA VACCINE  06/28/2024   DTaP/Tdap/Td (2 - Td or Tdap) 08/25/2024   Medicare Annual Wellness (AWV)  03/25/2025   Colonoscopy  05/24/2026   Pneumonia Vaccine 22+ Years old  Completed   DEXA SCAN  Completed   Hepatitis C Screening  Completed  HPV VACCINES  Aged Out   Meningococcal B Vaccine  Aged Out    Health Maintenance  Health Maintenance Due  Topic Date Due   Zoster  Vaccines- Shingrix (1 of 2) 09/21/1975    Colorectal cancer screening: Type of screening: Colonoscopy. Completed 05/24/16. Repeat every 10 years  Mammogram status: Completed 04/24/24. Repeat every year  Bone Density status: Completed 04/25/23. Results reflect: Bone density results: OSTEOPENIA. Repeat every 3 years.  Lung Cancer Screening: (Low Dose CT Chest recommended if Age 71-80 years, 20 pack-year currently smoking OR have quit w/in 15years.) does not qualify.   Additional Screening:  Hepatitis C Screening: does qualify; Completed 01/10/17  Dental Screening: Recommended annual dental exams for proper oral hygiene   Community Resource Referral / Chronic Care Management: CRR required this visit?  No   CCM required this visit?  No     Plan:     I have personally reviewed and noted the following in the patient's chart:   Medical and social history Use of alcohol, tobacco or illicit drugs  Current medications and supplements including opioid prescriptions. Patient is not currently taking opioid prescriptions. Functional ability and status Nutritional status Physical activity Advanced directives List of other physicians Hospitalizations, surgeries, and ER visits in previous 12 months Vitals Screenings to include cognitive, depression, and falls Referrals and appointments  In addition, I have reviewed and discussed with patient certain preventive protocols, quality metrics, and best practice recommendations. A written personalized care plan for preventive services as well as general preventive health recommendations were provided to patient.     Terre Ferri, DO   03/25/2024   After Visit Summary: (In Person-Printed) AVS printed and given to the patient  Nurse Notes: N/A

## 2024-03-25 NOTE — Assessment & Plan Note (Signed)
 Under good control on current regimen. Continue current regimen. Continue to monitor. Call with any concerns. Refills given. Labs drawn today.

## 2024-03-25 NOTE — Progress Notes (Signed)
 BP 135/73 (BP Location: Left Arm, Patient Position: Sitting, Cuff Size: Large)   Pulse 76   Ht 5' (1.524 m)   Wt 189 lb (85.7 kg)   SpO2 98%   BMI 36.91 kg/m    Subjective:    Patient ID: Jennifer Sanders, female    DOB: 03-07-1956, 68 y.o.   MRN: [id number]  HPI: Jennifer Sanders is a 68 y.o. female presenting on 03/25/2024 for comprehensive medical examination. Current medical complaints include:  Got sick at the end of March- still coughing up a bit of spittle.   HYPERTENSION / HYPERLIPIDEMIA Satisfied with current treatment? yes Duration of hypertension: chronic BP monitoring frequency: not checking BP medication side effects: no Past BP meds: lisinopril  hctz Duration of hyperlipidemia: chronic Cholesterol medication side effects: N/A Cholesterol supplements: none Past cholesterol medications: none Medication compliance: excellent compliance Aspirin: no Recent stressors: no Recurrent headaches: no Visual changes: no Palpitations: no Dyspnea: no Chest pain: no Lower extremity edema: no Dizzy/lightheaded: no  Menopausal Symptoms: no  Depression Screen done today and results listed below:     03/25/2024   10:25 AM 03/24/2023   10:23 AM 09/21/2022   10:17 AM 03/21/2022   10:36 AM 01/18/2022   11:06 AM  Depression screen PHQ 2/9  Decreased Interest 0 0 0 0 0  Down, Depressed, Hopeless 0 0 0 0 0  PHQ - 2 Score 0 0 0 0 0  Altered sleeping 0 0 0 0   Tired, decreased energy 0 0 0 0   Change in appetite 0 0 0 0   Feeling bad or failure about yourself  0 0 0 0   Trouble concentrating 0 0 0 0   Moving slowly or fidgety/restless 0 0 0 0   Suicidal thoughts 0 0 0 0   PHQ-9 Score 0 0 0 0   Difficult doing work/chores Not difficult at all Not difficult at all Not difficult at all      Past Medical History:  Past Medical History:  Diagnosis Date   Bilateral carpal tunnel syndrome    Breast cancer (HCC)    left breast   Diverticulosis    found on colonoscopy on  05/24/16   Hypertension    Meralgia paraesthetica, left    Personal history of radiation therapy     Surgical History:  Past Surgical History:  Procedure Laterality Date   BREAST BIOPSY Left 12/18/2020   [facility]   BREAST LUMPECTOMY Left 01/01/2021   BREAST LUMPECTOMY,RADIO FREQ LOCALIZER,AXILLARY SENTINEL LYMPH NODE BIOPSY Left 01/01/2021   Procedure: BREAST LUMPECTOMY,RADIO FREQ LOCALIZER,AXILLARY SENTINEL LYMPH NODE BIOPSY;  Surgeon: Flynn Hylan, MD;  Location: [facility] ORS;  Service: General;  Laterality: Left;  Left axilla   CHOLECYSTECTOMY  1990s   COLONOSCOPY  05/24/2016   internal hemorrhoids, diverticulosis   DENTAL SURGERY  2023    Medications:  Current Outpatient Medications on File Prior to Visit  Medication Sig   letrozole  (FEMARA ) 2.5 MG tablet Take 1 tablet (2.5 mg total) by mouth daily.   pyridoxine (B-6) 200 MG tablet Take 200 mg by mouth daily.   No current facility-administered medications on file prior to visit.    Allergies:  No Known Allergies  Social History:  Social History   Socioeconomic History   Marital status: Widowed    Spouse name: Not on file   Number of children: Not on file   Years of education: Not on file   Highest education level: Associate degree: academic program  Occupational History   Not on file  Tobacco Use   Smoking status: Never   Smokeless tobacco: Never  Vaping Use   Vaping status: Never Used  Substance and Sexual Activity   Alcohol use: No   Drug use: No   Sexual activity: Yes  Other Topics Concern   Not on file  Social History Narrative   Not on file   Social Drivers of Health   Financial Resource Strain: Low Risk  (03/21/2024)   Overall Financial Resource Strain (CARDIA)    Difficulty of Paying Living Expenses: Not very hard  Food Insecurity: No Food Insecurity (03/21/2024)   Hunger Vital Sign    Worried About Running Out of Food in the Last Year: Never true    Ran Out of Food in the Last Year: Never true   Transportation Needs: No Transportation Needs (03/21/2024)   PRAPARE - Administrator, Civil Service (Medical): No    Lack of Transportation (Non-Medical): No  Physical Activity: Insufficiently Active (03/21/2024)   Exercise Vital Sign    Days of Exercise per Week: 1 day    Minutes of Exercise per Session: 10 min  Stress: No Stress Concern Present (03/21/2024)   [employer] of Occupational Health - Occupational Stress Questionnaire    Feeling of Stress : Only a little  Social Connections: Socially Isolated (03/21/2024)   Social Connection and Isolation Panel [NHANES]    Frequency of Communication with Friends and Family: Twice a week    Frequency of Social Gatherings with Friends and Family: Once a week    Attends Religious Services: Never    Database administrator or Organizations: No    Attends Engineer, structural: Not on file    Marital Status: Widowed  Intimate Partner Violence: Not At Risk (03/25/2024)   Humiliation, Afraid, Rape, and Kick questionnaire    Fear of Current or Ex-Partner: No    Emotionally Abused: No    Physically Abused: No    Sexually Abused: No   Social History   Tobacco Use  Smoking Status Never  Smokeless Tobacco Never   Social History   Substance and Sexual Activity  Alcohol Use No    Family History:  Family History  Problem Relation Age of Onset   Hyperlipidemia Mother    Cancer Mother        lung   Cancer Father        esophageal   Throat cancer Maternal Grandmother    Breast cancer Neg Hx     Past medical history, surgical history, medications, allergies, family history and social history reviewed with patient today and changes made to appropriate areas of the chart.   Review of Systems  Constitutional: Negative.   HENT:  Positive for ear discharge. Negative for congestion, ear pain, hearing loss, nosebleeds, sinus pain, sore throat and tinnitus.   Eyes: Negative.   Respiratory:  Positive for cough and  shortness of breath. Negative for hemoptysis, sputum production, wheezing and stridor.   Cardiovascular: Negative.   Gastrointestinal: Negative.   Genitourinary: Negative.   Musculoskeletal: Negative.   Skin: Negative.   Neurological:  Positive for tingling (from CTS). Negative for dizziness, tremors, sensory change, speech change, focal weakness, seizures, loss of consciousness, weakness and headaches.  Endo/Heme/Allergies:  Positive for environmental allergies. Negative for polydipsia. Does not bruise/bleed easily.  Psychiatric/Behavioral: Negative.     All other ROS negative except what is listed above and in the HPI.  Objective:    BP 135/73 (BP Location: Left Arm, Patient Position: Sitting, Cuff Size: Large)   Pulse 76   Ht 5' (1.524 m)   Wt 189 lb (85.7 kg)   SpO2 98%   BMI 36.91 kg/m   Wt Readings from Last 3 Encounters:  03/25/24 189 lb (85.7 kg)  10/12/23 194 lb 14.4 oz (88.4 kg)  09/25/23 192 lb 6.4 oz (87.3 kg)    Physical Exam Vitals and nursing note reviewed.  Constitutional:      General: She is not in acute distress.    Appearance: Normal appearance. She is not ill-appearing, toxic-appearing or diaphoretic.  HENT:     Head: Normocephalic and atraumatic.     Right Ear: Tympanic membrane, ear canal and external ear normal. There is no impacted cerumen.     Left Ear: Tympanic membrane, ear canal and external ear normal. There is no impacted cerumen.     Nose: Nose normal. No congestion or rhinorrhea.     Mouth/Throat:     Mouth: Mucous membranes are moist.     Pharynx: Oropharynx is clear. No oropharyngeal exudate or posterior oropharyngeal erythema.  Eyes:     General: No scleral icterus.       Right eye: No discharge.        Left eye: No discharge.     Extraocular Movements: Extraocular movements intact.     Conjunctiva/sclera: Conjunctivae normal.     Pupils: Pupils are equal, round, and reactive to light.  Neck:     Vascular: No carotid bruit.   Cardiovascular:     Rate and Rhythm: Normal rate and regular rhythm.     Pulses: Normal pulses.     Heart sounds: No murmur heard.    No friction rub. No gallop.  Pulmonary:     Effort: Pulmonary effort is normal. No respiratory distress.     Breath sounds: Normal breath sounds. No stridor. No wheezing, rhonchi or rales.  Chest:     Chest wall: No tenderness.  Abdominal:     General: Abdomen is flat. Bowel sounds are normal. There is no distension.     Palpations: Abdomen is soft. There is no mass.     Tenderness: There is no abdominal tenderness. There is no right CVA tenderness, left CVA tenderness, guarding or rebound.     Hernia: No hernia is present.  Genitourinary:    Comments: Breast and pelvic exams deferred with shared decision making Musculoskeletal:        General: No swelling, tenderness, deformity or signs of injury.     Cervical back: Normal range of motion and neck supple. No rigidity. No muscular tenderness.     Right lower leg: No edema.     Left lower leg: No edema.  Lymphadenopathy:     Cervical: No cervical adenopathy.  Skin:    General: Skin is warm and dry.     Capillary Refill: Capillary refill takes less than 2 seconds.     Coloration: Skin is not jaundiced or pale.     Findings: No bruising, erythema, lesion or rash.  Neurological:     General: No focal deficit present.     Mental Status: She is alert and oriented to person, place, and time. Mental status is at baseline.     Cranial Nerves: No cranial nerve deficit.     Sensory: No sensory deficit.     Motor: No weakness.     Coordination: Coordination normal.     Gait:  Gait normal.     Deep Tendon Reflexes: Reflexes normal.  Psychiatric:        Mood and Affect: Mood normal.        Behavior: Behavior normal.        Thought Content: Thought content normal.        Judgment: Judgment normal.     Results for orders placed or performed in visit on 09/25/23  Comprehensive metabolic panel    Collection Time: 09/25/23 10:30 AM  Result Value Ref Range   Glucose 101 (H) 70 - 99 mg/dL   BUN 11 8 - 27 mg/dL   Creatinine, Ser 7.82 0.57 - 1.00 mg/dL   eGFR 72 >95 AO/ZHY/8.65   BUN/Creatinine Ratio 13 12 - 28   Sodium 140 134 - 144 mmol/L   Potassium 4.2 3.5 - 5.2 mmol/L   Chloride 98 96 - 106 mmol/L   CO2 21 20 - 29 mmol/L   Calcium 10.2 8.7 - 10.3 mg/dL   Total Protein 7.6 6.0 - 8.5 g/dL   Albumin 5.0 (H) 3.9 - 4.9 g/dL   Globulin, Total 2.6 1.5 - 4.5 g/dL   Bilirubin Total 0.5 0.0 - 1.2 mg/dL   Alkaline Phosphatase 89 44 - 121 IU/L   AST 24 0 - 40 IU/L   ALT 18 0 - 32 IU/L  Lipid Panel w/o Chol/HDL Ratio   Collection Time: 09/25/23 10:30 AM  Result Value Ref Range   Cholesterol, Total 219 (H) 100 - 199 mg/dL   Triglycerides 784 (H) 0 - 149 mg/dL   HDL 62 >69 mg/dL   VLDL Cholesterol Cal 27 5 - 40 mg/dL   LDL Chol Calc (NIH) 629 (H) 0 - 99 mg/dL  CBC with Differential/Platelet   Collection Time: 09/25/23 10:30 AM  Result Value Ref Range   WBC 8.0 3.4 - 10.8 x10E3/uL   RBC 4.86 3.77 - 5.28 x10E6/uL   Hemoglobin 15.2 11.1 - 15.9 g/dL   Hematocrit 52.8 41.3 - 46.6 %   MCV 87 79 - 97 fL   MCH 31.3 26.6 - 33.0 pg   MCHC 35.8 (H) 31.5 - 35.7 g/dL   RDW 24.4 01.0 - 27.2 %   Platelets 222 150 - 450 x10E3/uL   Neutrophils 74 Not Estab. %   Lymphs 16 Not Estab. %   Monocytes 8 Not Estab. %   Eos 1 Not Estab. %   Basos 1 Not Estab. %   Neutrophils Absolute 5.9 1.4 - 7.0 x10E3/uL   Lymphocytes Absolute 1.3 0.7 - 3.1 x10E3/uL   Monocytes Absolute 0.6 0.1 - 0.9 x10E3/uL   EOS (ABSOLUTE) 0.1 0.0 - 0.4 x10E3/uL   Basophils Absolute 0.0 0.0 - 0.2 x10E3/uL   Immature Granulocytes 0 Not Estab. %   Immature Grans (Abs) 0.0 0.0 - 0.1 x10E3/uL      Assessment & Plan:   Problem List Items Addressed This Visit       Cardiovascular and Mediastinum   Hypertension   Under good control on current regimen. Continue current regimen. Continue to monitor. Call with any concerns.  Refills given. Labs drawn today.        Relevant Medications   lisinopril -hydrochlorothiazide  (ZESTORETIC ) 20-12.5 MG tablet   Other Relevant Orders   Microalbumin, Urine Waived   Comprehensive metabolic panel with GFR   TSH   Senile purpura (HCC)   Reassured patient. Continue to monitor. Call with any concerns.       Relevant Medications   lisinopril -hydrochlorothiazide  (ZESTORETIC ) 20-12.5 MG tablet  Other Relevant Orders   CBC with Differential/Platelet     Other   Malignant neoplasm of upper-outer quadrant of left female breast (HCC)   Continue to follow with oncology. Call with any concerns. Continue to monitor.       Hyperlipidemia   Rechecking labs today. Await results. Treat as needed.       Relevant Medications   lisinopril -hydrochlorothiazide  (ZESTORETIC ) 20-12.5 MG tablet   Other Relevant Orders   Comprehensive metabolic panel with GFR   Lipid Panel w/o Chol/HDL Ratio   Other Visit Diagnoses       Encounter for Medicare annual wellness exam    -  Primary   Preventative care discussed today as below.     Routine general medical examination at a health care facility       Vaccines up to date/declined. Screening labs checked today. Mammo and DEXA scheduled. Colonoscopy up to date. Continue diet and exercise. Call with concerns.     Open wound of left lower leg, initial encounter       Healing well. Due for Td. Given today.   Relevant Orders   Td : Tetanus/diphtheria >7yo Preservative  free     Subacute cough       Will treat with albuterol as needed. Call with any concerns.        Follow up plan: Return in about 6 months (around 09/24/2024).   LABORATORY TESTING:  - Pap smear: not applicable  IMMUNIZATIONS:   - Tdap: Tetanus vaccination status reviewed: last tetanus booster within 10 years. - Influenza: Up to date - Pneumovax: Up to date - Prevnar: Up to date - COVID: Up to date - HPV: Not applicable - Shingrix vaccine: Given  elsewhere  SCREENING: -Mammogram: Up to date  - Colonoscopy: Up to date  - Bone Density: Up to date   PATIENT COUNSELING:   Advised to take 1 mg of folate supplement per day if capable of pregnancy.   Sexuality: Discussed sexually transmitted diseases, partner selection, use of condoms, avoidance of unintended pregnancy  and contraceptive alternatives.   Advised to avoid cigarette smoking.  I discussed with the patient that most people either abstain from alcohol or drink within safe limits (<=14/week and <=4 drinks/occasion for males, <=7/weeks and <= 3 drinks/occasion for females) and that the risk for alcohol disorders and other health effects rises proportionally with the number of drinks per week and how often a drinker exceeds daily limits.  Discussed cessation/primary prevention of drug use and availability of treatment for abuse.   Diet: Encouraged to adjust caloric intake to maintain  or achieve ideal body weight, to reduce intake of dietary saturated fat and total fat, to limit sodium intake by avoiding high sodium foods and not adding table salt, and to maintain adequate dietary potassium and calcium preferably from fresh fruits, vegetables, and low-fat dairy products.    stressed the importance of regular exercise  Injury prevention: Discussed safety belts, safety helmets, smoke detector, smoking near bedding or upholstery.   Dental health: Discussed importance of regular tooth brushing, flossing, and dental visits.    NEXT PREVENTATIVE PHYSICAL DUE IN 1 YEAR. Return in about 6 months (around 09/24/2024).

## 2024-03-25 NOTE — Assessment & Plan Note (Signed)
 Rechecking labs today. Await results. Treat as needed.

## 2024-03-26 ENCOUNTER — Encounter: Payer: Self-pay | Admitting: Family Medicine

## 2024-03-26 LAB — COMPREHENSIVE METABOLIC PANEL WITH GFR
ALT: 16 IU/L (ref 0–32)
AST: 27 IU/L (ref 0–40)
Albumin: 4.6 g/dL (ref 3.9–4.9)
Alkaline Phosphatase: 93 IU/L (ref 44–121)
BUN/Creatinine Ratio: 8 — ABNORMAL LOW (ref 12–28)
BUN: 7 mg/dL — ABNORMAL LOW (ref 8–27)
Bilirubin Total: 0.6 mg/dL (ref 0.0–1.2)
CO2: 23 mmol/L (ref 20–29)
Calcium: 10 mg/dL (ref 8.7–10.3)
Chloride: 97 mmol/L (ref 96–106)
Creatinine, Ser: 0.86 mg/dL (ref 0.57–1.00)
Globulin, Total: 3.1 g/dL (ref 1.5–4.5)
Glucose: 97 mg/dL (ref 70–99)
Potassium: 4.1 mmol/L (ref 3.5–5.2)
Sodium: 138 mmol/L (ref 134–144)
Total Protein: 7.7 g/dL (ref 6.0–8.5)
eGFR: 74 mL/min/{1.73_m2} (ref >59)

## 2024-03-26 LAB — LIPID PANEL W/O CHOL/HDL RATIO
Cholesterol, Total: 186 mg/dL (ref 100–199)
HDL: 56 mg/dL (ref >39)
LDL Chol Calc (NIH): 104 mg/dL — ABNORMAL HIGH (ref 0–99)
Triglycerides: 146 mg/dL (ref 0–149)
VLDL Cholesterol Cal: 26 mg/dL (ref 5–40)

## 2024-03-26 LAB — CBC WITH DIFFERENTIAL/PLATELET
Basophils Absolute: 0 10*3/uL (ref 0.0–0.2)
Basos: 0 %
EOS (ABSOLUTE): 0.1 10*3/uL (ref 0.0–0.4)
Eos: 1 %
Hematocrit: 42.7 % (ref 34.0–46.6)
Hemoglobin: 14.8 g/dL (ref 11.1–15.9)
Immature Grans (Abs): 0 10*3/uL (ref 0.0–0.1)
Immature Granulocytes: 0 %
Lymphocytes Absolute: 1.4 10*3/uL (ref 0.7–3.1)
Lymphs: 14 %
MCH: 30.4 pg (ref 26.6–33.0)
MCHC: 34.7 g/dL (ref 31.5–35.7)
MCV: 88 fL (ref 79–97)
Monocytes Absolute: 0.8 10*3/uL (ref 0.1–0.9)
Monocytes: 8 %
Neutrophils Absolute: 7.7 10*3/uL — ABNORMAL HIGH (ref 1.4–7.0)
Neutrophils: 77 %
Platelets: 250 10*3/uL (ref 150–450)
RBC: 4.87 x10E6/uL (ref 3.77–5.28)
RDW: 13.7 % (ref 11.7–15.4)
WBC: 10.1 10*3/uL (ref 3.4–10.8)

## 2024-03-26 LAB — TSH: TSH: 2.35 u[IU]/mL (ref 0.450–4.500)

## 2024-04-25 ENCOUNTER — Ambulatory Visit
Admission: RE | Admit: 2024-04-25 | Discharge: 2024-04-25 | Disposition: A | Discharge status: Home / Self Care | Source: Ambulatory Visit | Attending: Oncology | Admitting: Oncology

## 2024-04-25 DIAGNOSIS — C50412 Malignant neoplasm of upper-outer quadrant of left female breast: Secondary | ICD-10-CM | POA: Diagnosis not present

## 2024-04-25 DIAGNOSIS — Z79811 Long term (current) use of aromatase inhibitors: Secondary | ICD-10-CM

## 2024-04-25 DIAGNOSIS — Z170 Estrogen receptor positive status [ER+]: Secondary | ICD-10-CM | POA: Insufficient documentation

## 2024-04-25 DIAGNOSIS — R92333 Mammographic heterogeneous density, bilateral breasts: Secondary | ICD-10-CM | POA: Diagnosis not present

## 2024-04-25 DIAGNOSIS — M810 Age-related osteoporosis without current pathological fracture: Secondary | ICD-10-CM | POA: Diagnosis not present

## 2024-04-25 DIAGNOSIS — Z780 Asymptomatic menopausal state: Secondary | ICD-10-CM | POA: Diagnosis not present

## 2024-05-16 ENCOUNTER — Ambulatory Visit

## 2024-06-27 ENCOUNTER — Ambulatory Visit

## 2024-07-15 DIAGNOSIS — D3132 Benign neoplasm of left choroid: Secondary | ICD-10-CM | POA: Diagnosis not present

## 2024-07-15 DIAGNOSIS — H43393 Other vitreous opacities, bilateral: Secondary | ICD-10-CM | POA: Diagnosis not present

## 2024-07-15 DIAGNOSIS — H2513 Age-related nuclear cataract, bilateral: Secondary | ICD-10-CM | POA: Diagnosis not present

## 2024-07-15 DIAGNOSIS — H35033 Hypertensive retinopathy, bilateral: Secondary | ICD-10-CM | POA: Diagnosis not present

## 2024-07-16 DIAGNOSIS — Z0100 Encounter for examination of eyes and vision without abnormal findings: Secondary | ICD-10-CM | POA: Diagnosis not present

## 2024-08-01 ENCOUNTER — Encounter: Payer: Self-pay | Admitting: Oncology

## 2024-08-01 ENCOUNTER — Inpatient Hospital Stay: Attending: Oncology | Admitting: Oncology

## 2024-08-01 DIAGNOSIS — C50412 Malignant neoplasm of upper-outer quadrant of left female breast: Secondary | ICD-10-CM

## 2024-08-01 DIAGNOSIS — Z79811 Long term (current) use of aromatase inhibitors: Secondary | ICD-10-CM | POA: Diagnosis not present

## 2024-08-01 DIAGNOSIS — Z170 Estrogen receptor positive status [ER+]: Secondary | ICD-10-CM

## 2024-08-01 NOTE — Progress Notes (Unsigned)
 [facility]  Telephone:([phone] [phone]) [phone]  ID: Jennifer Sanders OB: 1956-09-04  MR#: [id number]  RDW#:[id number]  Patient Care Team: Vicci Duwaine SQUIBB, DO as PCP - General (Family Medicine) Lenn Aran, MD as Consulting Physician (Radiation Oncology) Jacobo Evalene PARAS, MD as Consulting Physician (Oncology) Lane Shope, MD as Consulting Physician (General Surgery)  I connected with Jennifer Sanders on August 01, 2024 at  3:30 PM EDT by video enabled telemedicine visit and verified that I am speaking with the correct person using two identifiers.   I discussed the limitations, risks, security and privacy concerns of performing an evaluation and management service by telemedicine and the availability of in-person appointments. I also discussed with the patient that there may be a patient responsible charge related to this service. The patient expressed understanding and agreed to proceed.   Other persons participating in the visit and their role in the encounter: Patient, MD.  Patient's location: Home. Provider's location: Clinic.  CHIEF COMPLAINT: Stage Ia ER/PR positive, HER-2 negative invasive carcinoma of the upper outer quadrant left breast.  Oncotype DX score low risk 8.  INTERVAL HISTORY: Patient agreed to video-assisted telemedicine visit for routine 53-month evaluation.  She continues to tolerate letrozole  without significant side effects.  She currently feels well and is asymptomatic. She has no neurologic complaints.  She denies any recent fevers or illnesses.  She has a good appetite and denies weight loss.  She has no chest pain, shortness of breath, cough, or hemoptysis.  She denies any nausea, vomiting, constipation, or diarrhea.  She has no urinary complaints.  Patient offers no specific complaints today.  REVIEW OF SYSTEMS:   Review of Systems  Constitutional: Negative.  Negative for fever and malaise/fatigue.  Respiratory:  Negative.  Negative for cough, hemoptysis and shortness of breath.   Cardiovascular: Negative.  Negative for chest pain and leg swelling.  Gastrointestinal: Negative.  Negative for abdominal pain.  Genitourinary: Negative.  Negative for dysuria.  Musculoskeletal: Negative.  Negative for back pain.  Skin: Negative.  Negative for rash.  Neurological: Negative.  Negative for dizziness, focal weakness, weakness and headaches.  Psychiatric/Behavioral: Negative.  The patient is not nervous/anxious.     As per HPI. Otherwise, a complete review of systems is negative.  PAST MEDICAL HISTORY: Past Medical History:  Diagnosis Date   Bilateral carpal tunnel syndrome    Breast cancer (HCC)    left breast   Diverticulosis    found on colonoscopy on 05/24/16   Hypertension    Meralgia paraesthetica, left    Personal history of radiation therapy     PAST SURGICAL HISTORY: Past Surgical History:  Procedure Laterality Date   BREAST BIOPSY Left 12/18/2020   [facility]   BREAST LUMPECTOMY Left 01/01/2021   BREAST LUMPECTOMY,RADIO FREQ LOCALIZER,AXILLARY SENTINEL LYMPH NODE BIOPSY Left 01/01/2021   Procedure: BREAST LUMPECTOMY,RADIO FREQ LOCALIZER,AXILLARY SENTINEL LYMPH NODE BIOPSY;  Surgeon: Lane Shope, MD;  Location: [facility] ORS;  Service: General;  Laterality: Left;  Left axilla   CHOLECYSTECTOMY  1990s   COLONOSCOPY  05/24/2016   internal hemorrhoids, diverticulosis   DENTAL SURGERY  2023    FAMILY HISTORY: Family History  Problem Relation Age of Onset   Hyperlipidemia Mother    Cancer Mother        lung   Cancer Father        esophageal   Throat cancer Maternal Grandmother    Breast cancer Neg Hx     ADVANCED DIRECTIVES (Y/N):  N  HEALTH MAINTENANCE: Social History   Tobacco Use   Smoking status: Never   Smokeless tobacco: Never  Vaping Use   Vaping status: Never Used  Substance Use Topics   Alcohol use: No   Drug use: No     Colonoscopy:  PAP:  Bone density:  Lipid  panel:  No Known Allergies  Current Outpatient Medications  Medication Sig Dispense Refill   letrozole  (FEMARA ) 2.5 MG tablet Take 1 tablet (2.5 mg total) by mouth daily. 90 tablet 3   lisinopril -hydrochlorothiazide  (ZESTORETIC ) 20-12.5 MG tablet Take 1 tablet by mouth daily. 90 tablet 1   pyridoxine (B-6) 200 MG tablet Take 200 mg by mouth daily.     No current facility-administered medications for this visit.    OBJECTIVE: There were no vitals filed for this visit.    There is no height or weight on file to calculate BMI.    ECOG FS:0 - Asymptomatic  General: Well-developed, well-nourished, no acute distress. HEENT: Normocephalic. Neuro: Alert, answering all questions appropriately. Cranial nerves grossly intact. Psych: Normal affect.  LAB RESULTS:  Lab Results  Component Value Date   NA 138 03/25/2024   K 4.1 03/25/2024   CL 97 03/25/2024   CO2 23 03/25/2024   GLUCOSE 97 03/25/2024   BUN 7 (L) 03/25/2024   CREATININE 0.86 03/25/2024   CALCIUM 10.0 03/25/2024   PROT 7.7 03/25/2024   ALBUMIN 4.6 03/25/2024   AST 27 03/25/2024   ALT 16 03/25/2024   ALKPHOS 93 03/25/2024   BILITOT 0.6 03/25/2024   GFRNONAA >60 12/24/2020   GFRAA 77 09/16/2020    Lab Results  Component Value Date   WBC 10.1 03/25/2024   NEUTROABS 7.7 (H) 03/25/2024   HGB 14.8 03/25/2024   HCT 42.7 03/25/2024   MCV 88 03/25/2024   PLT 250 03/25/2024     STUDIES: No results found.  ASSESSMENT: Stage Ia ER/PR positive, HER-2 negative invasive carcinoma of the upper outer quadrant left breast.  Oncotype DX score low risk 8.  PLAN:    Stage Ia ER/PR positive, HER-2 negative invasive carcinoma of the upper outer quadrant left breast: Oncotype DX score low risk 8.  Given patient's stage of disease and low risk Oncotype, she did not require adjuvant chemotherapy.  Patient completed XRT on Apr 12, 2021.  Continue letrozole  for a total of 5 years completing treatment in May 2027.  Her most recent  mammogram on Apr 25, 2024 was reported BI-RADS 2.  Repeat in May 2026.  Patient will follow-up with video-assisted telemedicine visit 1 week after her next mammogram.   Osteopenia: Chronic and unchanged.  Patient's most recent bone mineral density on Apr 25, 2024 was reported unchanged from previous.  Continue calcium and vitamin D supplementation.  Repeat bone mineral density along with mammogram as above. Hand stiffness: Resolved.  Unrelated to letrozole .  Per patient, symptoms resolved with calcium, vitamin D, and magnesium supplementation.  I provided 20 minutes of face-to-face video visit time during this encounter which included chart review, counseling, and coordination of care as documented above.    Patient expressed understanding and was in agreement with this plan. She also understands that She can call clinic at any time with any questions, concerns, or complaints.    Cancer Staging  Malignant neoplasm of upper-outer quadrant of left female breast [facility]) Staging form: Breast, AJCC 8th Edition - Pathologic stage from 01/22/2021: Stage IA (pT1c, pN0, cM0, G1, ER+, PR+, HER2-, Oncotype DX score: 8) - Signed by Jacobo Lye  J, MD on 01/22/2021 Stage prefix: Initial diagnosis Multigene prognostic tests performed: Oncotype DX Recurrence score range: Less than 11 Histologic grading system: 3 grade system   Evalene JINNY Reusing, MD   08/01/2024 4:02 PM

## 2024-08-01 NOTE — Progress Notes (Unsigned)
 Patient had a Bone Density scan on 04/25/2024.  Patient said that she is doing better when it comes to her cough.

## 2024-08-02 ENCOUNTER — Other Ambulatory Visit: Payer: Self-pay | Admitting: Oncology

## 2024-08-02 DIAGNOSIS — Z170 Estrogen receptor positive status [ER+]: Secondary | ICD-10-CM

## 2024-09-24 ENCOUNTER — Encounter: Payer: Self-pay | Admitting: Family Medicine

## 2024-09-24 ENCOUNTER — Ambulatory Visit (INDEPENDENT_AMBULATORY_CARE_PROVIDER_SITE_OTHER): Admitting: Family Medicine

## 2024-09-24 VITALS — BP 125/85 | HR 71 | Temp 97.5°F | Ht 60.0 in | Wt 188.8 lb

## 2024-09-24 DIAGNOSIS — H6123 Impacted cerumen, bilateral: Secondary | ICD-10-CM

## 2024-09-24 DIAGNOSIS — I10 Essential (primary) hypertension: Secondary | ICD-10-CM | POA: Diagnosis not present

## 2024-09-24 DIAGNOSIS — D692 Other nonthrombocytopenic purpura: Secondary | ICD-10-CM | POA: Diagnosis not present

## 2024-09-24 DIAGNOSIS — E785 Hyperlipidemia, unspecified: Secondary | ICD-10-CM

## 2024-09-24 MED ORDER — LISINOPRIL-HYDROCHLOROTHIAZIDE 20-12.5 MG PO TABS
1.0000 | ORAL_TABLET | Freq: Every day | ORAL | 1 refills | Status: AC
Start: 2024-09-24 — End: ?

## 2024-09-24 NOTE — Assessment & Plan Note (Signed)
 Rechecking labs today. Await results. Treat as needed.

## 2024-09-24 NOTE — Assessment & Plan Note (Signed)
 Reassured patient. Continue to monitor.

## 2024-09-24 NOTE — Progress Notes (Signed)
 BP 125/85   Pulse 71   Temp (!) 97.5 F (36.4 C) (Oral)   Ht 5' (1.524 m)   Wt 188 lb 12.8 oz (85.6 kg)   SpO2 100%   BMI 36.87 kg/m    Subjective:    Patient ID: Jennifer Sanders, female    DOB: 29-Jul-1956, 68 y.o.   MRN: [id number]  HPI: Jennifer Sanders is a 68 y.o. female  Chief Complaint  Patient presents with   Hypertension   Hyperlipidemia   HYPERTENSION / HYPERLIPIDEMIA Satisfied with current treatment? yes Duration of hypertension: chronic BP monitoring frequency: not checking BP medication side effects: no Past BP meds: lisinopril -HCTZ Duration of hyperlipidemia: chronic Cholesterol medication side effects: N/A Cholesterol supplements: none Past cholesterol medications: None Medication compliance: excellent compliance Aspirin: no Recent stressors: no Recurrent headaches: no Visual changes: no Palpitations: no Dyspnea: no Chest pain: no Lower extremity edema: no Dizzy/lightheaded: no  Relevant past medical, surgical, family and social history reviewed and updated as indicated. Interim medical history since our last visit reviewed. Allergies and medications reviewed and updated.  Review of Systems  Constitutional: Negative.   Respiratory: Negative.    Cardiovascular: Negative.   Musculoskeletal: Negative.   Psychiatric/Behavioral: Negative.      Per HPI unless specifically indicated above     Objective:    BP 125/85   Pulse 71   Temp (!) 97.5 F (36.4 C) (Oral)   Ht 5' (1.524 m)   Wt 188 lb 12.8 oz (85.6 kg)   SpO2 100%   BMI 36.87 kg/m   Wt Readings from Last 3 Encounters:  09/24/24 188 lb 12.8 oz (85.6 kg)  03/25/24 189 lb (85.7 kg)  10/12/23 194 lb 14.4 oz (88.4 kg)    Physical Exam Vitals and nursing note reviewed.  Constitutional:      General: She is not in acute distress.    Appearance: Normal appearance. She is obese. She is not ill-appearing, toxic-appearing or diaphoretic.  HENT:     Head: Normocephalic and atraumatic.      Right Ear: External ear normal. There is impacted cerumen.     Left Ear: External ear normal. There is impacted cerumen.     Nose: Nose normal.     Mouth/Throat:     Mouth: Mucous membranes are moist.     Pharynx: Oropharynx is clear.  Eyes:     General: No scleral icterus.       Right eye: No discharge.        Left eye: No discharge.     Extraocular Movements: Extraocular movements intact.     Conjunctiva/sclera: Conjunctivae normal.     Pupils: Pupils are equal, round, and reactive to light.  Cardiovascular:     Rate and Rhythm: Normal rate and regular rhythm.     Pulses: Normal pulses.     Heart sounds: Normal heart sounds. No murmur heard.    No friction rub. No gallop.  Pulmonary:     Effort: Pulmonary effort is normal. No respiratory distress.     Breath sounds: Normal breath sounds. No stridor. No wheezing, rhonchi or rales.  Chest:     Chest wall: No tenderness.  Musculoskeletal:        General: Normal range of motion.     Cervical back: Normal range of motion and neck supple.  Skin:    General: Skin is warm and dry.     Capillary Refill: Capillary refill takes less than 2 seconds.  Coloration: Skin is not jaundiced or pale.     Findings: No bruising, erythema, lesion or rash.  Neurological:     General: No focal deficit present.     Mental Status: She is alert and oriented to person, place, and time. Mental status is at baseline.  Psychiatric:        Mood and Affect: Mood normal.        Behavior: Behavior normal.        Thought Content: Thought content normal.        Judgment: Judgment normal.     Results for orders placed or performed in visit on 03/25/24  Microalbumin, Urine Waived   Collection Time: 03/25/24 11:04 AM  Result Value Ref Range   Microalb, Ur Waived 10 0 - 19 mg/L   Creatinine, Urine Waived 50 10 - 300 mg/dL   Microalb/Creat Ratio 30-300 (H) <30 mg/g  CBC with Differential/Platelet   Collection Time: 03/25/24 11:07 AM  Result Value  Ref Range   WBC 10.1 3.4 - 10.8 x10E3/uL   RBC 4.87 3.77 - 5.28 x10E6/uL   Hemoglobin 14.8 11.1 - 15.9 g/dL   Hematocrit 57.2 65.9 - 46.6 %   MCV 88 79 - 97 fL   MCH 30.4 26.6 - 33.0 pg   MCHC 34.7 31.5 - 35.7 g/dL   RDW 86.2 88.2 - 84.5 %   Platelets 250 150 - 450 x10E3/uL   Neutrophils 77 Not Estab. %   Lymphs 14 Not Estab. %   Monocytes 8 Not Estab. %   Eos 1 Not Estab. %   Basos 0 Not Estab. %   Neutrophils Absolute 7.7 (H) 1.4 - 7.0 x10E3/uL   Lymphocytes Absolute 1.4 0.7 - 3.1 x10E3/uL   Monocytes Absolute 0.8 0.1 - 0.9 x10E3/uL   EOS (ABSOLUTE) 0.1 0.0 - 0.4 x10E3/uL   Basophils Absolute 0.0 0.0 - 0.2 x10E3/uL   Immature Granulocytes 0 Not Estab. %   Immature Grans (Abs) 0.0 0.0 - 0.1 x10E3/uL  Comprehensive metabolic panel with GFR   Collection Time: 03/25/24 11:07 AM  Result Value Ref Range   Glucose 97 70 - 99 mg/dL   BUN 7 (L) 8 - 27 mg/dL   Creatinine, Ser 9.13 0.57 - 1.00 mg/dL   eGFR 74 >40 fO/fpw/8.26   BUN/Creatinine Ratio 8 (L) 12 - 28   Sodium 138 134 - 144 mmol/L   Potassium 4.1 3.5 - 5.2 mmol/L   Chloride 97 96 - 106 mmol/L   CO2 23 20 - 29 mmol/L   Calcium 10.0 8.7 - 10.3 mg/dL   Total Protein 7.7 6.0 - 8.5 g/dL   Albumin 4.6 3.9 - 4.9 g/dL   Globulin, Total 3.1 1.5 - 4.5 g/dL   Bilirubin Total 0.6 0.0 - 1.2 mg/dL   Alkaline Phosphatase 93 44 - 121 IU/L   AST 27 0 - 40 IU/L   ALT 16 0 - 32 IU/L  Lipid Panel w/o Chol/HDL Ratio   Collection Time: 03/25/24 11:07 AM  Result Value Ref Range   Cholesterol, Total 186 100 - 199 mg/dL   Triglycerides 853 0 - 149 mg/dL   HDL 56 >60 mg/dL   VLDL Cholesterol Cal 26 5 - 40 mg/dL   LDL Chol Calc (NIH) 895 (H) 0 - 99 mg/dL  TSH   Collection Time: 03/25/24 11:07 AM  Result Value Ref Range   TSH 2.350 0.450 - 4.500 uIU/mL      Assessment & Plan:   Problem List Items Addressed  This Visit       Cardiovascular and Mediastinum   Hypertension - Primary   Under good control on current regimen. Continue  current regimen. Continue to monitor. Call with any concerns. Refills given. Labs drawn today.        Relevant Medications   lisinopril -hydrochlorothiazide  (ZESTORETIC ) 20-12.5 MG tablet   Other Relevant Orders   Comprehensive metabolic panel with GFR   Senile purpura   Reassured patient. Continue to monitor.       Relevant Medications   lisinopril -hydrochlorothiazide  (ZESTORETIC ) 20-12.5 MG tablet   Other Relevant Orders   CBC with Differential/Platelet   Comprehensive metabolic panel with GFR     Other   Hyperlipidemia   Rechecking labs today. Await results. Treat as needed.       Relevant Medications   lisinopril -hydrochlorothiazide  (ZESTORETIC ) 20-12.5 MG tablet   Other Relevant Orders   Comprehensive metabolic panel with GFR   Lipid Panel w/o Chol/HDL Ratio   Other Visit Diagnoses       Bilateral impacted cerumen            Follow up plan: Return in about 6 months (around 03/25/2025) for physical.

## 2024-09-24 NOTE — Assessment & Plan Note (Signed)
 Under good control on current regimen. Continue current regimen. Continue to monitor. Call with any concerns. Refills given. Labs drawn today.

## 2024-09-25 LAB — LIPID PANEL W/O CHOL/HDL RATIO
Cholesterol, Total: 182 mg/dL (ref 100–199)
HDL: 55 mg/dL (ref >39)
LDL Chol Calc (NIH): 103 mg/dL — ABNORMAL HIGH (ref 0–99)
Triglycerides: 135 mg/dL (ref 0–149)
VLDL Cholesterol Cal: 24 mg/dL (ref 5–40)

## 2024-09-25 LAB — CBC WITH DIFFERENTIAL/PLATELET
Basophils Absolute: 0 x10E3/uL (ref 0.0–0.2)
Basos: 0 %
EOS (ABSOLUTE): 0.1 x10E3/uL (ref 0.0–0.4)
Eos: 2 %
Hematocrit: 42.2 % (ref 34.0–46.6)
Hemoglobin: 14.9 g/dL (ref 11.1–15.9)
Immature Grans (Abs): 0 x10E3/uL (ref 0.0–0.1)
Immature Granulocytes: 0 %
Lymphocytes Absolute: 1.3 x10E3/uL (ref 0.7–3.1)
Lymphs: 18 %
MCH: 31.3 pg (ref 26.6–33.0)
MCHC: 35.3 g/dL (ref 31.5–35.7)
MCV: 89 fL (ref 79–97)
Monocytes Absolute: 0.6 x10E3/uL (ref 0.1–0.9)
Monocytes: 8 %
Neutrophils Absolute: 5.2 x10E3/uL (ref 1.4–7.0)
Neutrophils: 72 %
Platelets: 236 x10E3/uL (ref 150–450)
RBC: 4.76 x10E6/uL (ref 3.77–5.28)
RDW: 13.8 % (ref 11.7–15.4)
WBC: 7.2 x10E3/uL (ref 3.4–10.8)

## 2024-09-25 LAB — COMPREHENSIVE METABOLIC PANEL WITH GFR
ALT: 15 IU/L (ref 0–32)
AST: 25 IU/L (ref 0–40)
Albumin: 4.4 g/dL (ref 3.9–4.9)
Alkaline Phosphatase: 81 IU/L (ref 49–135)
BUN/Creatinine Ratio: 7 — ABNORMAL LOW (ref 12–28)
BUN: 6 mg/dL — ABNORMAL LOW (ref 8–27)
Bilirubin Total: 0.7 mg/dL (ref 0.0–1.2)
CO2: 25 mmol/L (ref 20–29)
Calcium: 9.8 mg/dL (ref 8.7–10.3)
Chloride: 98 mmol/L (ref 96–106)
Creatinine, Ser: 0.9 mg/dL (ref 0.57–1.00)
Globulin, Total: 3.4 g/dL (ref 1.5–4.5)
Glucose: 100 mg/dL — ABNORMAL HIGH (ref 70–99)
Potassium: 4.1 mmol/L (ref 3.5–5.2)
Sodium: 139 mmol/L (ref 134–144)
Total Protein: 7.8 g/dL (ref 6.0–8.5)
eGFR: 70 mL/min/1.73 (ref >59)

## 2024-09-27 ENCOUNTER — Ambulatory Visit: Payer: Self-pay | Admitting: Family Medicine

## 2024-10-10 ENCOUNTER — Encounter: Payer: Self-pay | Admitting: Radiation Oncology

## 2024-10-10 ENCOUNTER — Ambulatory Visit
Admission: RE | Admit: 2024-10-10 | Discharge: 2024-10-10 | Disposition: A | Discharge status: Home / Self Care | Payer: Medicare HMO | Source: Ambulatory Visit | Attending: Radiation Oncology | Admitting: Radiation Oncology

## 2024-10-10 VITALS — BP 138/88 | HR 78 | Temp 98.2°F | Resp 16 | Wt 190.0 lb

## 2024-10-10 DIAGNOSIS — Z923 Personal history of irradiation: Secondary | ICD-10-CM | POA: Diagnosis not present

## 2024-10-10 DIAGNOSIS — C50412 Malignant neoplasm of upper-outer quadrant of left female breast: Secondary | ICD-10-CM | POA: Diagnosis not present

## 2024-10-10 DIAGNOSIS — Z170 Estrogen receptor positive status [ER+]: Secondary | ICD-10-CM | POA: Insufficient documentation

## 2024-10-10 DIAGNOSIS — Z79811 Long term (current) use of aromatase inhibitors: Secondary | ICD-10-CM | POA: Diagnosis not present

## 2024-10-10 NOTE — Progress Notes (Signed)
 Radiation Oncology Follow up Note  Name: Jennifer Sanders   Date:   10/10/2024 MRN:  [id number] DOB: Mar 22, 1956    This 68 y.o. female presents to the clinic today for 3-year follow-up status post whole breast radiation to her left breast for stage I ER positive invasive mammary carcinoma.  REFERRING PROVIDER: Vicci Duwaine SQUIBB, DO  HPI: Patient is a 68 year old female now out over 3 years having completed whole breast radiation to her left breast for stage I ER positive invasive mammary carcinoma.  She is seen today in routine follow-up she is doing well she specifically denies breast tenderness cough or bone pain.  She is having problems with her left thumb I thought it was arthritis that has improved with calcium treatments.  She is currently on.  Femara  tolerating it well without side effect.  She had mammograms back in May which I have reviewed and they were BI-RADS 2 benign.  COMPLICATIONS OF TREATMENT: none  FOLLOW UP COMPLIANCE: keeps appointments   PHYSICAL EXAM:  BP 138/88   Pulse 78   Temp 98.2 F (36.8 C) (Tympanic)   Resp 16   Wt 190 lb (86.2 kg)   BMI 37.11 kg/m  Lungs are clear to A&P cardiac examination essentially unremarkable with regular rate and rhythm. No dominant mass or nodularity is noted in either breast in 2 positions examined. Incision is well-healed. No axillary or supraclavicular adenopathy is appreciated. Cosmetic result is excellent.  Well-developed well-nourished patient in NAD. HEENT reveals PERLA, EOMI, discs not visualized.  Oral cavity is clear. No oral mucosal lesions are identified. Neck is clear without evidence of cervical or supraclavicular adenopathy. Lungs are clear to A&P. Cardiac examination is essentially unremarkable with regular rate and rhythm without murmur rub or thrill. Abdomen is benign with no organomegaly or masses noted. Motor sensory and DTR levels are equal and symmetric in the upper and lower extremities. Cranial nerves II through  XII are grossly intact. Proprioception is intact. No peripheral adenopathy or edema is identified. No motor or sensory levels are noted. Crude visual fields are within normal range.  RADIOLOGY RESULTS: Mammograms reviewed compatible with above-stated findings  PLAN: Present time patient is now out over 3 years with no evidence of disease.  She continues on endocrine therapy without side effect.  I am going to turn follow-up care over to medical oncology.  I would be happy to reevaluate this patient anytime the future should that be indicated.  Patient knows to call with any concerns.  I would like to take this opportunity to thank you for allowing me to participate in the care of your patient.SABRA Marcey Penton, MD

## 2025-03-27 ENCOUNTER — Encounter: Admitting: Family Medicine

## 2025-04-17 ENCOUNTER — Telehealth: Admitting: Oncology

## 2025-04-28 ENCOUNTER — Encounter

## 2025-05-01 ENCOUNTER — Ambulatory Visit
